# Patient Record
Sex: Female | Born: 1990 | Race: Black or African American | Hispanic: No | Smoking: Former smoker
Health system: Southern US, Community
[De-identification: ages and names within clinical notes are randomized; demographics above are authoritative.]

## PROBLEM LIST (undated history)

## (undated) ENCOUNTER — Inpatient Hospital Stay (HOSPITAL_COMMUNITY): Payer: Self-pay

## (undated) DIAGNOSIS — D649 Anemia, unspecified: Secondary | ICD-10-CM

## (undated) DIAGNOSIS — O139 Gestational [pregnancy-induced] hypertension without significant proteinuria, unspecified trimester: Secondary | ICD-10-CM

## (undated) DIAGNOSIS — R51 Headache: Secondary | ICD-10-CM

## (undated) DIAGNOSIS — I10 Essential (primary) hypertension: Secondary | ICD-10-CM

## (undated) DIAGNOSIS — A749 Chlamydial infection, unspecified: Secondary | ICD-10-CM

## (undated) HISTORY — PX: HAND SURGERY: SHX662

---
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
## 2010-02-12 ENCOUNTER — Inpatient Hospital Stay (HOSPITAL_COMMUNITY): Admission: AD | Admit: 2010-02-12 | Discharge: 2010-02-12 | Payer: Self-pay | Admitting: Obstetrics

## 2010-02-18 ENCOUNTER — Inpatient Hospital Stay (HOSPITAL_COMMUNITY): Admission: AD | Admit: 2010-02-18 | Discharge: 2010-02-19 | Payer: Self-pay | Admitting: Obstetrics

## 2010-12-19 LAB — CBC
HCT: 28.2 % — ABNORMAL LOW (ref 36.0–46.0)
Hemoglobin: 8.5 g/dL — ABNORMAL LOW (ref 12.0–15.0)
Hemoglobin: 9.4 g/dL — ABNORMAL LOW (ref 12.0–15.0)
MCHC: 33.1 g/dL (ref 30.0–36.0)
MCV: 69.2 fL — ABNORMAL LOW (ref 78.0–100.0)
MCV: 70.2 fL — ABNORMAL LOW (ref 78.0–100.0)
Platelets: 278 10*3/uL (ref 150–400)
RBC: 3.65 MIL/uL — ABNORMAL LOW (ref 3.87–5.11)
RDW: 20 % — ABNORMAL HIGH (ref 11.5–15.5)
RDW: 20.3 % — ABNORMAL HIGH (ref 11.5–15.5)
WBC: 11.6 10*3/uL — ABNORMAL HIGH (ref 4.0–10.5)

## 2011-01-16 LAB — CBC
HCT: 29 % — ABNORMAL LOW (ref 36.0–49.0)
MCHC: 32.7 g/dL (ref 31.0–37.0)
MCV: 76.3 fL — ABNORMAL LOW (ref 78.0–98.0)
Platelets: 216 10*3/uL (ref 150–400)
Platelets: 237 10*3/uL (ref 150–400)
RBC: 4.73 MIL/uL (ref 3.80–5.70)
RDW: 15.7 % — ABNORMAL HIGH (ref 11.4–15.5)
WBC: 11.9 10*3/uL (ref 4.5–13.5)

## 2011-01-16 LAB — RPR: RPR Ser Ql: NONREACTIVE

## 2011-02-14 NOTE — H&P (Signed)
NAMEOLUWATONI, ROTUNNO           ACCOUNT NO.:  [id number]   MEDICAL RECORD NO.:  [id number]          PATIENT TYPE:  INP   LOCATION:  9121                          FACILITY:  [facility]   PHYSICIAN:  Roseanna Rainbow, M.D.DATE OF BIRTH:  [birth year]   DATE OF ADMISSION:  10/23/2008  DATE OF DISCHARGE:                              HISTORY & PHYSICAL   CHIEF COMPLAINT:  The patient is a 20 year old para 0 with an estimated  date of confinement of October 27, 2008, with an intrauterine pregnancy  at 39 plus weeks complaining of contractions.   HISTORY OF PRESENT ILLNESS:  Please see the above.   ALLERGIES:  No known drug allergies.   MEDICATIONS:  Prenatal vitamins.   PRENATAL LABORATORIES:  Chlamydia positive, August 12.  Urine culture  and sensitivity, greater than 100,000 colonies per mL.  Staphylococcus  coagulase negative.  GC probe negative.  One-hour GTT 78.  Hepatitis B  surface antigen negative.  Hematocrit 29.9, hemoglobin 10.5.  HIV  nonreactive.  Quad screen negative.  Blood type is AB positive.  Antibody screen negative.  Platelets 265,000.  RPR nonreactive.  Rubella  immune.  Sickle cell negative.  GBS positive on September 21, 2009.   PAST GYN HISTORY:  Noncontributory.   OB RISK FACTORS:  Positive Chlamydia.  DNA probe and  GBS positive on  September 21, 2009.   PAST MEDICAL HISTORY:  No significant history of medical diseases.   PAST SURGICAL HISTORY:  No previous surgery.   SOCIAL HISTORY:  She is a Consulting civil engineer, single.  Does not give any  significant history of alcohol use.  Has no significant smoking history.  There is a history of previous marijuana use.   FAMILY HISTORY:  Remarkable for diabetes, hypertension.   PHYSICAL EXAMINATION:  Vital signs stable, afebrile.  Fetal heart  tracing reassuring.  Tocodynamometer, uterine contractions every 2-4  minutes.  GU:  Sterile vaginal exam 6 cm dilated, 80-90% effaced with vertex at +1  station.  There are prominent  spines.  The membranes were artificially  ruptured for clear fluid.   ASSESSMENT:  Intrauterine pregnancy at term, active labor, GBS positive,  currently on penicillin.  Fetal heart tracing consistent with fetal well-  being.   PLAN:  Admission, expectant management, anticipate a spontaneous vaginal  delivery.      Roseanna Rainbow, M.D.  Electronically Signed     LAJ/MEDQ  D:  10/23/2008  T:  10/24/2008  Job:  [id number]

## 2011-06-29 LAB — URINALYSIS, ROUTINE W REFLEX MICROSCOPIC
Glucose, UA: NEGATIVE
Ketones, ur: NEGATIVE
Leukocytes, UA: NEGATIVE
Nitrite: POSITIVE — AB
Protein, ur: NEGATIVE
Urobilinogen, UA: 0.2

## 2011-06-29 LAB — POCT PREGNANCY, URINE
Operator id: 20265
Preg Test, Ur: POSITIVE

## 2011-06-29 LAB — URINE MICROSCOPIC-ADD ON

## 2012-02-22 ENCOUNTER — Encounter (HOSPITAL_COMMUNITY): Payer: Self-pay | Admitting: *Deleted

## 2012-02-22 ENCOUNTER — Inpatient Hospital Stay (HOSPITAL_COMMUNITY)
Admission: AD | Admit: 2012-02-22 | Discharge: 2012-02-22 | Disposition: A | Discharge status: Home / Self Care | Payer: Self-pay | Source: Ambulatory Visit | Attending: Obstetrics & Gynecology | Admitting: Obstetrics & Gynecology

## 2012-02-22 ENCOUNTER — Inpatient Hospital Stay (HOSPITAL_COMMUNITY): Payer: Self-pay

## 2012-02-22 DIAGNOSIS — R42 Dizziness and giddiness: Secondary | ICD-10-CM | POA: Insufficient documentation

## 2012-02-22 DIAGNOSIS — D649 Anemia, unspecified: Secondary | ICD-10-CM

## 2012-02-22 DIAGNOSIS — Z3490 Encounter for supervision of normal pregnancy, unspecified, unspecified trimester: Secondary | ICD-10-CM

## 2012-02-22 DIAGNOSIS — O99891 Other specified diseases and conditions complicating pregnancy: Secondary | ICD-10-CM | POA: Insufficient documentation

## 2012-02-22 LAB — DIFFERENTIAL
Basophils Relative: 0 % (ref 0–1)
Eosinophils Absolute: 0.3 10*3/uL (ref 0.0–0.7)
Lymphs Abs: 1.8 10*3/uL (ref 0.7–4.0)
Neutro Abs: 4 10*3/uL (ref 1.7–7.7)
Neutrophils Relative %: 60 % (ref 43–77)

## 2012-02-22 LAB — COMPREHENSIVE METABOLIC PANEL
ALT: 11 U/L (ref 0–35)
Albumin: 3.1 g/dL — ABNORMAL LOW (ref 3.5–5.2)
Alkaline Phosphatase: 51 U/L (ref 39–117)
Chloride: 102 mEq/L (ref 96–112)
Potassium: 3.6 mEq/L (ref 3.5–5.1)
Sodium: 137 mEq/L (ref 135–145)
Total Bilirubin: 0.2 mg/dL — ABNORMAL LOW (ref 0.3–1.2)
Total Protein: 6.3 g/dL (ref 6.0–8.3)

## 2012-02-22 LAB — URINE MICROSCOPIC-ADD ON

## 2012-02-22 LAB — URINALYSIS, ROUTINE W REFLEX MICROSCOPIC
Bilirubin Urine: NEGATIVE
Glucose, UA: NEGATIVE mg/dL
Ketones, ur: NEGATIVE mg/dL
Nitrite: NEGATIVE
Specific Gravity, Urine: 1.025 (ref 1.005–1.030)
pH: 6.5 (ref 5.0–8.0)

## 2012-02-22 LAB — CBC
Hemoglobin: 9.9 g/dL — ABNORMAL LOW (ref 12.0–15.0)
MCH: 24.5 pg — ABNORMAL LOW (ref 26.0–34.0)
MCHC: 34.3 g/dL (ref 30.0–36.0)
Platelets: 240 10*3/uL (ref 150–400)
RBC: 4.04 MIL/uL (ref 3.87–5.11)

## 2012-02-22 MED ORDER — FERROUS SULFATE 325 (65 FE) MG PO TABS: 325.0000 mg | ORAL_TABLET | Freq: Two times a day (BID) | ORAL | Status: DC

## 2012-02-22 NOTE — MAU Note (Signed)
Past 2 months, gets short of breath at work, if is in the shower too long has passed out.  Feels dizzy when up.

## 2012-02-22 NOTE — Discharge Instructions (Signed)
Drink 10-12 glasses of water per day. Eat small, frequent snacks/meals every two hours including protein and whole grains. Avoid hot showers. Start iron supplement twice a day and increase iron rich foods in your diet.  Change positions slowly.

## 2012-02-22 NOTE — MAU Note (Signed)
To bathroom to collect urine, instructed pt to call for help, call light shown.

## 2012-02-22 NOTE — MAU Provider Note (Signed)
Meagan [name] 78 y.o.G3P2002 @Unknown  by LMP Chief Complaint  Patient presents with  . Dizziness  . Possible Pregnancy     First Provider Initiated Contact with Patient 02/22/12 1133      SUBJECTIVE  HPI: Pt reports dizziness after long, hot showers and standing a long time at work. Denies syncope, heart palpitations, chest pain, SOB. Unknown LMP late March, ~9 weeks. Denies N/V/D, vaginal bleeding or abd pain. No similar Sx outside of pregnancy.   History reviewed. No pertinent past medical history. History reviewed. No pertinent past surgical history. History   Social History  . Marital Status: Single    Spouse Name: N/A    Number of Children: N/A  . Years of Education: N/A   Occupational History  . Not on file.   Social History Main Topics  . Smoking status: Former Games developer  . Smokeless tobacco: Not on file  . Alcohol Use: No  . Drug Use:   . Sexually Active: Yes   Other Topics Concern  . Not on file   Social History Narrative  . No narrative on file   No current facility-administered medications on file prior to encounter.   No current outpatient prescriptions on file prior to encounter.   No Known Allergies  ROS: Pertinent items in HPI  OBJECTIVE Blood pressure 107/58, pulse 88, temperature 98.1 F (36.7 C), temperature source Oral, resp. rate 16. No orthostatic changes  GENERAL: Well-developed, well-nourished female in no acute distress.  HEENT: Normocephalic, good dentition, no palor HEART: normal rate RESP: normal effort ABDOMEN: Soft, nontender EXTREMITIES: Nontender, no edema NEURO: Alert and oriented SPECULUM EXAM: deferred   LAB RESULTS Results for orders placed during the hospital encounter of 02/22/12 (from the past 24 hour(s))  URINALYSIS, ROUTINE W REFLEX MICROSCOPIC     Status: Abnormal   Collection Time   02/22/12  9:50 AM      Component Value Range   Color, Urine YELLOW  YELLOW    APPearance CLEAR  CLEAR    Specific Gravity,  Urine 1.025  1.005 - 1.030    pH 6.5  5.0 - 8.0    Glucose, UA NEGATIVE  NEGATIVE (mg/dL)   Hgb urine dipstick NEGATIVE  NEGATIVE    Bilirubin Urine NEGATIVE  NEGATIVE    Ketones, ur NEGATIVE  NEGATIVE (mg/dL)   Protein, ur NEGATIVE  NEGATIVE (mg/dL)   Urobilinogen, UA 0.2  0.0 - 1.0 (mg/dL)   Nitrite NEGATIVE  NEGATIVE    Leukocytes, UA TRACE (*) NEGATIVE   URINE MICROSCOPIC-ADD ON     Status: Abnormal   Collection Time   02/22/12  9:50 AM      Component Value Range   Squamous Epithelial / LPF MANY (*) RARE    WBC, UA 3-6  <3 (WBC/hpf)   Bacteria, UA RARE  RARE   POCT PREGNANCY, URINE     Status: Abnormal   Collection Time   02/22/12 10:17 AM      Component Value Range   Preg Test, Ur POSITIVE (*) NEGATIVE   CBC     Status: Abnormal   Collection Time   02/22/12 10:40 AM      Component Value Range   WBC 6.7  4.0 - 10.5 (K/uL)   RBC 4.04  3.87 - 5.11 (MIL/uL)   Hemoglobin 9.9 (*) 12.0 - 15.0 (g/dL)   HCT 29.5 (*) 62.1 - 46.0 (%)   MCV 71.5 (*) 78.0 - 100.0 (fL)   MCH 24.5 (*) 26.0 - 34.0 (pg)  MCHC 34.3  30.0 - 36.0 (g/dL)   RDW 21.3  08.6 - 57.8 (%)   Platelets 240  150 - 400 (K/uL)  DIFFERENTIAL     Status: Normal   Collection Time   02/22/12 10:40 AM      Component Value Range   Neutrophils Relative 60  43 - 77 (%)   Neutro Abs 4.0  1.7 - 7.7 (K/uL)   Lymphocytes Relative 27  12 - 46 (%)   Lymphs Abs 1.8  0.7 - 4.0 (K/uL)   Monocytes Relative 8  3 - 12 (%)   Monocytes Absolute 0.6  0.1 - 1.0 (K/uL)   Eosinophils Relative 5  0 - 5 (%)   Eosinophils Absolute 0.3  0.0 - 0.7 (K/uL)   Basophils Relative 0  0 - 1 (%)   Basophils Absolute 0.0  0.0 - 0.1 (K/uL)  COMPREHENSIVE METABOLIC PANEL     Status: Abnormal   Collection Time   02/22/12 10:40 AM      Component Value Range   Sodium 137  135 - 145 (mEq/L)   Potassium 3.6  3.5 - 5.1 (mEq/L)   Chloride 102  96 - 112 (mEq/L)   CO2 27  19 - 32 (mEq/L)   Glucose, Bld 75  70 - 99 (mg/dL)   BUN 8  6 - 23 (mg/dL)    Creatinine, Ser 4.69 (*) 0.50 - 1.10 (mg/dL)   Calcium 8.8  8.4 - 62.9 (mg/dL)   Total Protein 6.3  6.0 - 8.3 (g/dL)   Albumin 3.1 (*) 3.5 - 5.2 (g/dL)   AST 10  0 - 37 (U/L)   ALT 11  0 - 35 (U/L)   Alkaline Phosphatase 51  39 - 117 (U/L)   Total Bilirubin 0.2 (*) 0.3 - 1.2 (mg/dL)   GFR calc non Af Amer >90  >90 (mL/min)   GFR calc Af Amer >90  >90 (mL/min)  GLUCOSE, CAPILLARY     Status: Normal   Collection Time   02/22/12 11:22 AM      Component Value Range   Glucose-Capillary 93  70 - 99 (mg/dL)   Comment 1 Notify RN      IMAGING 13.3 week IUP, + FHR   ASSESSMENT 1. Anemia   2. Normal pregnancy   3. Pregnancy with uncertain dates    PLAN D/C home Small frequent meals, increase fluids. No hot showers Iron BID Increase dietary iron Follow-up Information    Please follow up. (start prenatal care or MAU as needed if symptoms worsen)         Medication List  As of 02/24/2012  1:09 AM   START taking these medications         ferrous sulfate 325 (65 FE) MG tablet   Take 1 tablet (325 mg total) by mouth 2 (two) times daily.         CONTINUE taking these medications         multivitamin tablet         STOP taking these medications         ibuprofen 200 MG tablet          Where to get your medications    These are the prescriptions that you need to pick up.   You may get these medications from any pharmacy.         ferrous sulfate 325 (65 FE) MG tablet            [place], Koleen Nimrod 02/22/2012  11:46 AM

## 2012-02-22 NOTE — MAU Note (Addendum)
Pt complains of dizziness especially when getting out of shower and at work for approx 2 months when getting hot. States some greenish discharge when wiping.

## 2012-02-23 DIAGNOSIS — D649 Anemia, unspecified: Secondary | ICD-10-CM

## 2012-02-28 NOTE — MAU Provider Note (Signed)
Medical Screening exam and patient care preformed by advanced practice provider.  Agree with the above management.  

## 2012-10-02 NOTE — L&D Delivery Note (Signed)
Delivery Note At 9:38 AM a viable female was delivered via Vaginal, Spontaneous Delivery (Presentation: ;  ).  APGAR: , ; weight .   Placenta status: Intact, Manual removal.  Cord:  with the following complications: .  Cord pH: not done  Anesthesia: Epidural  Episiotomy:  Lacerations:  Suture Repair: 2.0 Est. Blood Loss (mL):   Mom to postpartum.  Baby to nursery-stable.  Prem Coykendall A 04/18/2013, 9:52 AM

## 2012-12-31 ENCOUNTER — Inpatient Hospital Stay (HOSPITAL_COMMUNITY)
Admission: AD | Admit: 2012-12-31 | Discharge: 2012-12-31 | Disposition: A | Discharge status: Home / Self Care | Payer: Medicaid Other | Source: Ambulatory Visit | Attending: Obstetrics & Gynecology | Admitting: Obstetrics & Gynecology

## 2012-12-31 ENCOUNTER — Encounter (HOSPITAL_COMMUNITY): Payer: Self-pay

## 2012-12-31 ENCOUNTER — Inpatient Hospital Stay (HOSPITAL_COMMUNITY): Payer: Medicaid Other

## 2012-12-31 DIAGNOSIS — Z3201 Encounter for pregnancy test, result positive: Secondary | ICD-10-CM

## 2012-12-31 DIAGNOSIS — O0930 Supervision of pregnancy with insufficient antenatal care, unspecified trimester: Secondary | ICD-10-CM | POA: Insufficient documentation

## 2012-12-31 DIAGNOSIS — O99891 Other specified diseases and conditions complicating pregnancy: Secondary | ICD-10-CM | POA: Insufficient documentation

## 2012-12-31 NOTE — MAU Note (Signed)
Patient is in requesting to obtain an ultrasound to verify date of gestation. She denies pain, vaginal bleeding or abnormal discharge.

## 2012-12-31 NOTE — MAU Provider Note (Signed)
  History     CSN: [id number]  Arrival date and time: 12/31/12 1022   None     No chief complaint on file.  HPI [name] is a 22 y.o. G3P2002 at [redacted]w[redacted]d who presents to MAU today for pregnancy confirmation. She denies abdominal pain, vaginal bleeding, N/V or fever. She has had occasional morning sickness, but feels she is still taking in adequate nutrition and hydration. She had some bleeding around the end of December. She is planning to return to Dr. Clearance Coots for prenatal care since that is where she went for her last pregnancy.   OB History   Grav Para Term Preterm Abortions TAB SAB Ect Mult Living   3 2 2  0 0 0 0 0 0 2      History reviewed. No pertinent past medical history.  History reviewed. No pertinent past surgical history.  History reviewed. No pertinent family history.  History  Substance Use Topics  . Smoking status: Former Games developer  . Smokeless tobacco: Not on file  . Alcohol Use: No    Allergies: No Known Allergies  No prescriptions prior to admission    Review of Systems  Constitutional: Negative for fever and malaise/fatigue.  Gastrointestinal: Positive for nausea. Negative for vomiting and abdominal pain.  Genitourinary: Negative for dysuria, urgency and frequency.       + vaginal discharge No irritative symptoms Neg - vaginal bleeding   Physical Exam   Blood pressure 104/58, pulse 75, temperature 98.1 F (36.7 C), temperature source Oral, resp. rate 18, height 5\' 6"  (1.676 m), weight 168 lb 8 oz (76.431 kg), last menstrual period 10/02/2012.  Physical Exam  Constitutional: She is oriented to person, place, and time. She appears well-developed and well-nourished. No distress.  HENT:  Head: Normocephalic and atraumatic.  Cardiovascular: Normal rate and normal heart sounds.   Respiratory: Effort normal and breath sounds normal. No respiratory distress.  GI: Soft. Bowel sounds are normal. She exhibits no distension and no mass. There is  no tenderness. There is no rebound and no guarding.  Patient is measuring size greater than dates  Neurological: She is alert and oriented to person, place, and time.  Skin: Skin is warm and dry. No erythema.  Psychiatric: She has a normal mood and affect.   Results for orders placed during the hospital encounter of 12/31/12 (from the past 24 hour(s))  POCT PREGNANCY, URINE     Status: Abnormal   Collection Time    12/31/12 10:46 AM      Result Value Range   Preg Test, Ur POSITIVE (*) NEGATIVE    MAU Course  Procedures None  MDM Patient measuring greater than dates. [place] today to confirm dating.   Assessment and Plan  A: SIUP at 24w 5 d No prenatal care  P: Discharge home Patient encouraged to call Dr. Verdell Carmine office to see if they will take her for prenatal care this late Patient given contact information for Medicaid assistance and [facility] if needed Patient may return to MAU as needed or if her condition were to change or worsen  Freddi Starr, PA-C  12/31/2012, 3:51 PM

## 2013-03-07 LAB — OB RESULTS CONSOLE GC/CHLAMYDIA: Chlamydia: POSITIVE

## 2013-03-09 ENCOUNTER — Inpatient Hospital Stay (HOSPITAL_COMMUNITY)
Admission: AD | Admit: 2013-03-09 | Discharge: 2013-03-09 | Disposition: A | Discharge status: Home / Self Care | Payer: Medicaid Other | Source: Ambulatory Visit | Attending: Obstetrics | Admitting: Obstetrics

## 2013-03-09 ENCOUNTER — Encounter (HOSPITAL_COMMUNITY): Payer: Self-pay | Admitting: Family

## 2013-03-09 DIAGNOSIS — N949 Unspecified condition associated with female genital organs and menstrual cycle: Secondary | ICD-10-CM | POA: Insufficient documentation

## 2013-03-09 DIAGNOSIS — O4700 False labor before 37 completed weeks of gestation, unspecified trimester: Secondary | ICD-10-CM | POA: Insufficient documentation

## 2013-03-09 DIAGNOSIS — R109 Unspecified abdominal pain: Secondary | ICD-10-CM | POA: Insufficient documentation

## 2013-03-09 DIAGNOSIS — O479 False labor, unspecified: Secondary | ICD-10-CM

## 2013-03-09 DIAGNOSIS — O26893 Other specified pregnancy related conditions, third trimester: Secondary | ICD-10-CM

## 2013-03-09 HISTORY — DX: Essential (primary) hypertension: I10

## 2013-03-09 HISTORY — DX: Chlamydial infection, unspecified: A74.9

## 2013-03-09 HISTORY — DX: Anemia, unspecified: D64.9

## 2013-03-09 LAB — URINALYSIS, ROUTINE W REFLEX MICROSCOPIC
Bilirubin Urine: NEGATIVE
Glucose, UA: NEGATIVE mg/dL
Hgb urine dipstick: NEGATIVE
Ketones, ur: NEGATIVE mg/dL
Protein, ur: NEGATIVE mg/dL
pH: 7.5 (ref 5.0–8.0)

## 2013-03-09 LAB — URINE MICROSCOPIC-ADD ON

## 2013-03-09 LAB — WET PREP, GENITAL
Clue Cells Wet Prep HPF POC: NONE SEEN
Trich, Wet Prep: NONE SEEN
Yeast Wet Prep HPF POC: NONE SEEN

## 2013-03-09 MED ORDER — PRENATAL COMPLETE 14-0.4 MG PO TABS: 1.0000 | ORAL_TABLET | Freq: Every day | ORAL | Status: DC

## 2013-03-09 NOTE — MAU Note (Signed)
Pt states that she has not felt well throughout the night and when she woke up this morning she vomited and noticed a puddle of fluid on the floor. States after she noticed the fluid on the floor she started having abdominal cramping and wants to make sure that her water did not break.

## 2013-03-09 NOTE — MAU Provider Note (Signed)
History     CSN: [id number]  Arrival date and time: 03/09/13 1204   First Provider Initiated Contact with Patient 03/09/13 1243      Chief Complaint  Patient presents with  . Abdominal Pain  . possible rupture of membranes    HPI [name] is a 22 y.o. G3P2002 at [redacted]w[redacted]d who presents to MAU today with complaint of contractions and ?ROM. The patient states that she was woken up hourly with contractions over night. She had one episode of vomiting last night that cased her to have wetness in her underwear. She does not feel that this was urine. She states that she has had increased pressure in the lower abdomen. She denies UTI symptoms, fever, diarrhea or constipation. She reports good fetal movement.   OB History   Grav Para Term Preterm Abortions TAB SAB Ect Mult Living   3 2 2  0 0 0 0 0 0 2      Past Medical History  Diagnosis Date  . Anemia   . Hypertension   . Chlamydia     Past Surgical History  Procedure Laterality Date  . Hand surgery      History reviewed. No pertinent family history.  History  Substance Use Topics  . Smoking status: Former Games developer  . Smokeless tobacco: Never Used  . Alcohol Use: No    Allergies: No Known Allergies  Prescriptions prior to admission  Medication Sig Dispense Refill  . acetaminophen (TYLENOL) 325 MG tablet Take 325 mg by mouth every 6 (six) hours as needed for pain.      . ferrous sulfate 325 (65 FE) MG tablet Take 1 tablet (325 mg total) by mouth 2 (two) times daily.  30 tablet  11  . [DISCONTINUED] Aspirin-Acetaminophen (GOODYS BODY PAIN PO) Take 1 each by mouth daily as needed (body pain).        Review of Systems  Constitutional: Negative for fever and malaise/fatigue.  Gastrointestinal: Positive for nausea, vomiting and abdominal pain. Negative for diarrhea and constipation.  Genitourinary: Negative for dysuria, urgency and frequency.       Neg - vaginal bleeding + Discharge   Physical Exam   Blood  pressure 123/81, pulse 109, temperature 98.3 F (36.8 C), temperature source Oral, resp. rate 18, height 5\' 6"  (1.676 m), weight 168 lb (76.204 kg), last menstrual period 10/02/2012.  Physical Exam  Constitutional: She is oriented to person, place, and time. She appears well-developed and well-nourished. No distress.  HENT:  Head: Normocephalic and atraumatic.  Cardiovascular: Normal rate and normal heart sounds.   Respiratory: Effort normal and breath sounds normal. No respiratory distress.  GI: Soft. Bowel sounds are normal. She exhibits no distension. There is tenderness (mild tenderness to palpation of the lower pelvic region).  Genitourinary: Uterus is enlarged (appropriate for GA). Cervix exhibits discharge (mucus discharge noted at cervical os) and friability. Cervix exhibits no motion tenderness. There is bleeding (noted with cervical probe) around the vagina. Vaginal discharge (small amount of white, mucus discharge noted) found.  Neurological: She is alert and oriented to person, place, and time.  Skin: Skin is warm and dry. No erythema.  Psychiatric: She has a normal mood and affect.  Cervix: closed, thick and posterior  Results for orders placed during the hospital encounter of 03/09/13 (from the past 24 hour(s))  URINALYSIS, ROUTINE W REFLEX MICROSCOPIC     Status: Abnormal   Collection Time    03/09/13 12:20 PM      Result  Value Range   Color, Urine YELLOW  YELLOW   APPearance CLEAR  CLEAR   Specific Gravity, Urine 1.020  1.005 - 1.030   pH 7.5  5.0 - 8.0   Glucose, UA NEGATIVE  NEGATIVE mg/dL   Hgb urine dipstick NEGATIVE  NEGATIVE   Bilirubin Urine NEGATIVE  NEGATIVE   Ketones, ur NEGATIVE  NEGATIVE mg/dL   Protein, ur NEGATIVE  NEGATIVE mg/dL   Urobilinogen, UA 1.0  0.0 - 1.0 mg/dL   Nitrite NEGATIVE  NEGATIVE   Leukocytes, UA TRACE (*) NEGATIVE  URINE MICROSCOPIC-ADD ON     Status: Abnormal   Collection Time    03/09/13 12:20 PM      Result Value Range    Squamous Epithelial / LPF FEW (*) RARE   WBC, UA 0-2  <3 WBC/hpf   Urine-Other MUCOUS PRESENT    WET PREP, GENITAL     Status: Abnormal   Collection Time    03/09/13  1:08 PM      Result Value Range   Yeast Wet Prep HPF POC NONE SEEN  NONE SEEN   Trich, Wet Prep NONE SEEN  NONE SEEN   Clue Cells Wet Prep HPF POC NONE SEEN  NONE SEEN   WBC, Wet Prep HPF POC MANY (*) NONE SEEN   Fetal Monitoring: Baseline: 130 bpm, moderate variability, + accelerations, no decelerations Contractions: none MAU Course  Procedures None  MDM UA, Wet prep, GC/Chlamydia today  Assessment and Plan  A: Vaginal discharge in pregnancy Braxton hicks contractions  P: Discharge home Labor precautions reviwed Patient encouraged to follow-up in the office as scheduled Patient may return to MAU as needed   Freddi Starr, PA-C  03/09/2013, 1:34 PM

## 2013-03-09 NOTE — MAU Note (Signed)
Patient presents to MAU with c/o possible ROM at 2200; abdominal cramping and pressure began at that point; reports she had difficulty sleeping;  Reports she feels constantly thirsty; reports she worked 6 hrs yesterday and was on her feet all day; left work early.  Reports +FM; denies vaginal bleeding.

## 2013-03-11 LAB — GC/CHLAMYDIA PROBE AMP: CT Probe RNA: POSITIVE — AB

## 2013-03-25 ENCOUNTER — Ambulatory Visit (INDEPENDENT_AMBULATORY_CARE_PROVIDER_SITE_OTHER): Payer: Medicaid Other | Admitting: Obstetrics

## 2013-03-25 ENCOUNTER — Encounter: Payer: Self-pay | Admitting: Obstetrics

## 2013-03-25 ENCOUNTER — Encounter: Payer: Self-pay | Admitting: *Deleted

## 2013-03-25 VITALS — BP 121/74 | Temp 97.3°F | Wt 169.0 lb

## 2013-03-25 DIAGNOSIS — A549 Gonococcal infection, unspecified: Secondary | ICD-10-CM

## 2013-03-25 DIAGNOSIS — Z113 Encounter for screening for infections with a predominantly sexual mode of transmission: Secondary | ICD-10-CM

## 2013-03-25 DIAGNOSIS — A7489 Other chlamydial diseases: Secondary | ICD-10-CM

## 2013-03-25 DIAGNOSIS — A5609 Other chlamydial infection of lower genitourinary tract: Secondary | ICD-10-CM

## 2013-03-25 DIAGNOSIS — Z3480 Encounter for supervision of other normal pregnancy, unspecified trimester: Secondary | ICD-10-CM

## 2013-03-25 DIAGNOSIS — Z3483 Encounter for supervision of other normal pregnancy, third trimester: Secondary | ICD-10-CM

## 2013-03-25 DIAGNOSIS — A5400 Gonococcal infection of lower genitourinary tract, unspecified: Secondary | ICD-10-CM

## 2013-03-25 LAB — POCT URINALYSIS DIPSTICK
Glucose, UA: NEGATIVE
Ketones, UA: NEGATIVE
Urobilinogen, UA: NEGATIVE

## 2013-03-25 MED ORDER — AZITHROMYCIN 250 MG PO TABS: ORAL_TABLET | ORAL | Status: DC

## 2013-03-25 MED ORDER — CEFIXIME 400 MG PO TABS: 400.0000 mg | ORAL_TABLET | Freq: Every day | ORAL | Status: DC

## 2013-03-25 NOTE — Progress Notes (Signed)
Pulse-84 Pt c/o "bone pain" and "body aches" x 4 months.

## 2013-03-26 LAB — OBSTETRIC PANEL
Basophils Relative: 0 % (ref 0–1)
Eosinophils Absolute: 0.1 10*3/uL (ref 0.0–0.7)
Eosinophils Relative: 2 % (ref 0–5)
Hepatitis B Surface Ag: NEGATIVE
Lymphs Abs: 2 10*3/uL (ref 0.7–4.0)
MCH: 21.6 pg — ABNORMAL LOW (ref 26.0–34.0)
MCHC: 33.2 g/dL (ref 30.0–36.0)
MCV: 65.1 fL — ABNORMAL LOW (ref 78.0–100.0)
Neutrophils Relative %: 63 % (ref 43–77)
Platelets: 262 10*3/uL (ref 150–400)
RDW: 20 % — ABNORMAL HIGH (ref 11.5–15.5)

## 2013-03-26 LAB — VARICELLA ZOSTER ANTIBODY, IGG: Varicella IgG: 193.4 Index — ABNORMAL HIGH (ref <135.00)

## 2013-03-26 LAB — VITAMIN D 25 HYDROXY (VIT D DEFICIENCY, FRACTURES): Vit D, 25-Hydroxy: 19 ng/mL — ABNORMAL LOW (ref 30–89)

## 2013-03-26 LAB — HIV ANTIBODY (ROUTINE TESTING W REFLEX): HIV: NONREACTIVE

## 2013-03-27 LAB — HEMOGLOBINOPATHY EVALUATION
Hemoglobin Other: 28.6 % — ABNORMAL HIGH
Hgb A2 Quant: 3.3 % — ABNORMAL HIGH (ref 2.2–3.2)
Hgb A: 68.1 % — ABNORMAL LOW (ref 96.8–97.8)
Hgb S Quant: 0 %

## 2013-04-01 LAB — HGB ELECTROPHORESIS REFLEXED REPORT
Hemoglobin A - HGBRFX: 65.1 % — ABNORMAL LOW (ref >96.0)
Hemoglobin F - HGBRFX: 0 % (ref <2.0)

## 2013-04-02 ENCOUNTER — Encounter: Payer: Self-pay | Admitting: Obstetrics

## 2013-04-02 ENCOUNTER — Ambulatory Visit (INDEPENDENT_AMBULATORY_CARE_PROVIDER_SITE_OTHER): Payer: Medicaid Other | Admitting: Obstetrics

## 2013-04-02 VITALS — BP 101/59 | Temp 97.6°F | Wt 171.0 lb

## 2013-04-02 DIAGNOSIS — Z3483 Encounter for supervision of other normal pregnancy, third trimester: Secondary | ICD-10-CM

## 2013-04-02 DIAGNOSIS — Z3480 Encounter for supervision of other normal pregnancy, unspecified trimester: Secondary | ICD-10-CM

## 2013-04-02 LAB — POCT URINALYSIS DIPSTICK
Ketones, UA: NEGATIVE
Spec Grav, UA: 1.015

## 2013-04-02 NOTE — Progress Notes (Signed)
Pulse-95 Pt c/o increased vaginal pressure x 3 days.

## 2013-04-09 ENCOUNTER — Ambulatory Visit (INDEPENDENT_AMBULATORY_CARE_PROVIDER_SITE_OTHER): Payer: Medicaid Other | Admitting: Obstetrics

## 2013-04-09 ENCOUNTER — Encounter: Payer: Self-pay | Admitting: Obstetrics

## 2013-04-09 VITALS — BP 110/70 | Temp 97.7°F | Wt 172.0 lb

## 2013-04-09 DIAGNOSIS — Z3480 Encounter for supervision of other normal pregnancy, unspecified trimester: Secondary | ICD-10-CM

## 2013-04-09 DIAGNOSIS — Z3483 Encounter for supervision of other normal pregnancy, third trimester: Secondary | ICD-10-CM

## 2013-04-09 LAB — POCT URINALYSIS DIPSTICK
Glucose, UA: NEGATIVE
Leukocytes, UA: NEGATIVE
Nitrite, UA: NEGATIVE
pH, UA: 6

## 2013-04-09 NOTE — Progress Notes (Signed)
P 78 Patient has questions about which due date we are going to use.

## 2013-04-16 ENCOUNTER — Encounter (HOSPITAL_COMMUNITY): Payer: Self-pay

## 2013-04-16 ENCOUNTER — Inpatient Hospital Stay (HOSPITAL_COMMUNITY)
Admission: AD | Admit: 2013-04-16 | Discharge: 2013-04-17 | Disposition: A | Discharge status: Home / Self Care | Payer: Medicaid Other | Source: Ambulatory Visit | Attending: Obstetrics & Gynecology | Admitting: Obstetrics & Gynecology

## 2013-04-16 DIAGNOSIS — O26893 Other specified pregnancy related conditions, third trimester: Secondary | ICD-10-CM

## 2013-04-16 DIAGNOSIS — G43009 Migraine without aura, not intractable, without status migrainosus: Secondary | ICD-10-CM | POA: Insufficient documentation

## 2013-04-16 DIAGNOSIS — O99891 Other specified diseases and conditions complicating pregnancy: Secondary | ICD-10-CM | POA: Insufficient documentation

## 2013-04-16 HISTORY — DX: Headache: R51

## 2013-04-16 HISTORY — DX: Gestational (pregnancy-induced) hypertension without significant proteinuria, unspecified trimester: O13.9

## 2013-04-16 MED ORDER — DEXAMETHASONE SODIUM PHOSPHATE 10 MG/ML IJ SOLN
10.0000 mg | Freq: Once | INTRAMUSCULAR | Status: AC
  Administered 2013-04-17: 10 mg via INTRAVENOUS
  Filled 2013-04-16: qty 1

## 2013-04-16 MED ORDER — DIPHENHYDRAMINE HCL 50 MG/ML IJ SOLN
25.0000 mg | Freq: Once | INTRAMUSCULAR | Status: AC
  Administered 2013-04-17: 25 mg via INTRAVENOUS
  Filled 2013-04-16: qty 1

## 2013-04-16 MED ORDER — PROCHLORPERAZINE EDISYLATE 5 MG/ML IJ SOLN
10.0000 mg | Freq: Once | INTRAMUSCULAR | Status: AC
  Administered 2013-04-17: 10 mg via INTRAVENOUS
  Filled 2013-04-16: qty 2

## 2013-04-16 NOTE — MAU Note (Signed)
Migraine headache x 2 days. N/v & decreased appetite x 2 days. Taken tylenol with no relief. Denies contractions, leaking of fluid, or vaginal bleeding. Positive fetal movement.

## 2013-04-16 NOTE — MAU Provider Note (Signed)
Chief Complaint:  Headache   First Provider Initiated Contact with Patient 04/17/13 0008     HPI: Meagan [name] is a 22 y.o. G3P2002 at [redacted]w[redacted]d who presents to maternity admissions reporting migraine HA, nausea and one episode of blurry vision this evening, resolved. Has had similar HA's throughout pregnancy. Minimal relief w/ Tylenol. Some relief w/ fluids and rest. Denies fever, chills, neck pain, difficulty w/ speech or gait, URI Sx, epigastric pain, contractions, leakage of fluid or vaginal bleeding. Good fetal movement.   Pregnancy Course: Few mildly elevated BPs this pregnancy, normal at recent visits.   Past Medical History: Past Medical History  Diagnosis Date  . Anemia   . Chlamydia   . Headache(784.0)   . Hypertension     no meds  . Pregnancy induced hypertension     Past obstetric history: OB History   Grav Para Term Preterm Abortions TAB SAB Ect Mult Living   3 2 2  0 0 0 0 0 0 2     # Outc Date GA Lbr Len/2nd Wgt Sex Del Anes PTL Lv   1 TRM 1/10    M SVD   Yes   2 TRM 5/11    M SVD   Yes   3 CUR               Past Surgical History: Past Surgical History  Procedure Laterality Date  . Hand surgery      Family History: Family History  Problem Relation Age of Onset  . Hypertension    . Diabetes      Social History: History  Substance Use Topics  . Smoking status: Former Smoker    Types: Cigarettes  . Smokeless tobacco: Never Used  . Alcohol Use: No     Comment: early pregnancy    Allergies: No Known Allergies  Meds:  Prescriptions prior to admission  Medication Sig Dispense Refill  . acetaminophen (TYLENOL) 325 MG tablet Take 325 mg by mouth every 6 (six) hours as needed for pain.      . Prenatal Vit-Fe Fumarate-FA (PRENATAL COMPLETE) 14-0.4 MG TABS Take 1 tablet by mouth daily.  60 each  2  . [DISCONTINUED] ferrous sulfate 325 (65 FE) MG tablet Take 1 tablet (325 mg total) by mouth 2 (two) times daily.  30 tablet  11    ROS: Pertinent  findings in history of present illness.  Physical Exam  Blood pressure 112/60, pulse 91, temperature 97.6 F (36.4 C), temperature source Oral, resp. rate 18, last menstrual period 10/02/2012. GENERAL: Well-developed, well-nourished female in no acute distress.  HEENT: Normocephalic. No neck tenderness or facial tenderness. HEART: normal rate RESP: normal effort ABDOMEN: Soft, non-tender, gravid appropriate for gestational age EXTREMITIES: Nontender, no edema,. NEURO: alert and oriented. DTR's 2+, no clonus  FHT:  Baseline 140 , moderate variability, accelerations present, no decelerations Contractions: none   Labs: Results for orders placed during the hospital encounter of 04/16/13 (from the past 24 hour(s))  URINALYSIS, ROUTINE W REFLEX MICROSCOPIC     Status: Abnormal   Collection Time    04/16/13 11:43 PM      Result Value Range   Color, Urine YELLOW  YELLOW   APPearance CLEAR  CLEAR   Specific Gravity, Urine 1.020  1.005 - 1.030   pH 7.0  5.0 - 8.0   Glucose, UA NEGATIVE  NEGATIVE mg/dL   Hgb urine dipstick NEGATIVE  NEGATIVE   Bilirubin Urine NEGATIVE  NEGATIVE   Ketones, ur NEGATIVE  NEGATIVE mg/dL   Protein, ur NEGATIVE  NEGATIVE mg/dL   Urobilinogen, UA 1.0  0.0 - 1.0 mg/dL   Nitrite NEGATIVE  NEGATIVE   Leukocytes, UA TRACE (*) NEGATIVE  URINE MICROSCOPIC-ADD ON     Status: None   Collection Time    04/16/13 11:43 PM      Result Value Range   Squamous Epithelial / LPF RARE  RARE   WBC, UA 0-2  <3 WBC/hpf   RBC / HPF 0-2  <3 RBC/hpf  CBC     Status: Abnormal   Collection Time    04/16/13 11:50 PM      Result Value Range   WBC 8.6  4.0 - 10.5 K/uL   RBC 3.91  3.87 - 5.11 MIL/uL   Hemoglobin 8.4 (*) 12.0 - 15.0 g/dL   HCT 16.1 (*) 09.6 - 04.5 %   MCV 64.2 (*) 78.0 - 100.0 fL   MCH 21.5 (*) 26.0 - 34.0 pg   MCHC 33.5  30.0 - 36.0 g/dL   RDW 40.9 (*) 81.1 - 91.4 %   Platelets 206  150 - 400 K/uL  COMPREHENSIVE METABOLIC PANEL     Status: Abnormal    Collection Time    04/16/13 11:50 PM      Result Value Range   Sodium 137  135 - 145 mEq/L   Potassium 3.5  3.5 - 5.1 mEq/L   Chloride 103  96 - 112 mEq/L   CO2 26  19 - 32 mEq/L   Glucose, Bld 80  70 - 99 mg/dL   BUN 6  6 - 23 mg/dL   Creatinine, Ser 7.82  0.50 - 1.10 mg/dL   Calcium 9.2  8.4 - 95.6 mg/dL   Total Protein 5.9 (*) 6.0 - 8.3 g/dL   Albumin 2.7 (*) 3.5 - 5.2 g/dL   AST 9  0 - 37 U/L   ALT 7  0 - 35 U/L   Alkaline Phosphatase 129 (*) 39 - 117 U/L   Total Bilirubin 0.2 (*) 0.3 - 1.2 mg/dL   GFR calc non Af Amer >90  >90 mL/min   GFR calc Af Amer >90  >90 mL/min    Imaging:  No results found.  MAU Course: HA, nausea resolved w/ migraine cocktail.   Assessment: 1. Migraine without aura   2. Headache in pregnancy, antepartum, third trimester   No evidence of pre-eclampsia Plan: Discharge home in stable condition. Labor precautions, PIH precautions and fetal kick counts.     Follow-up Information   Follow up with Roseanna Rainbow, MD On 04/17/2013. (as scheduled)    Contact information:   [address] [place] [address] [phone]       Follow up with [facility] [place] [facility]ERN[facility] ADMISSIONS. (As needed if symptoms worsen)    Contact information:   [address] [id number] [place] [address] [phone]       Medication List         acetaminophen 325 MG tablet  Commonly known as:  TYLENOL  Take 325 mg by mouth every 6 (six) hours as needed for pain.     butalbital-acetaminophen-caffeine 50-325-40 MG per tablet  Commonly known as:  FIORICET  Take 1-2 tablets by mouth every 6 (six) hours as needed for headache.     ferrous sulfate 325 (65 FE) MG tablet  Take 1 tablet (325 mg total) by mouth 2 (two) times daily.     Prenatal Complete 14-0.4  MG Tabs  Take 1 tablet by mouth daily.        Dorathy Kinsman, CNM 04/17/2013 1:00 AM

## 2013-04-17 ENCOUNTER — Ambulatory Visit (INDEPENDENT_AMBULATORY_CARE_PROVIDER_SITE_OTHER): Payer: Medicaid Other | Admitting: Obstetrics

## 2013-04-17 VITALS — BP 121/75 | Temp 98.6°F | Wt 175.4 lb

## 2013-04-17 DIAGNOSIS — O480 Post-term pregnancy: Secondary | ICD-10-CM

## 2013-04-17 DIAGNOSIS — G43009 Migraine without aura, not intractable, without status migrainosus: Secondary | ICD-10-CM

## 2013-04-17 DIAGNOSIS — Z3480 Encounter for supervision of other normal pregnancy, unspecified trimester: Secondary | ICD-10-CM

## 2013-04-17 LAB — URINE MICROSCOPIC-ADD ON

## 2013-04-17 LAB — COMPREHENSIVE METABOLIC PANEL
ALT: 7 U/L (ref 0–35)
AST: 9 U/L (ref 0–37)
CO2: 26 mEq/L (ref 19–32)
Calcium: 9.2 mg/dL (ref 8.4–10.5)
GFR calc non Af Amer: >90 mL/min (ref >90)
Sodium: 137 mEq/L (ref 135–145)
Total Protein: 5.9 g/dL — ABNORMAL LOW (ref 6.0–8.3)

## 2013-04-17 LAB — POCT URINALYSIS DIPSTICK
Blood, UA: NEGATIVE
Nitrite, UA: NEGATIVE
Spec Grav, UA: <=1.005
Urobilinogen, UA: NEGATIVE
pH, UA: 7

## 2013-04-17 LAB — CBC
MCH: 21.5 pg — ABNORMAL LOW (ref 26.0–34.0)
Platelets: 206 10*3/uL (ref 150–400)
RBC: 3.91 MIL/uL (ref 3.87–5.11)

## 2013-04-17 LAB — URINALYSIS, ROUTINE W REFLEX MICROSCOPIC
Bilirubin Urine: NEGATIVE
Nitrite: NEGATIVE
Specific Gravity, Urine: 1.02 (ref 1.005–1.030)
Urobilinogen, UA: 1 mg/dL (ref 0.0–1.0)

## 2013-04-17 MED ORDER — FERROUS SULFATE 325 (65 FE) MG PO TABS: 325.0000 mg | ORAL_TABLET | Freq: Two times a day (BID) | ORAL | Status: DC

## 2013-04-17 MED ORDER — BUTALBITAL-APAP-CAFFEINE 50-325-40 MG PO TABS: 1.0000 | ORAL_TABLET | Freq: Four times a day (QID) | ORAL | Status: DC | PRN

## 2013-04-17 NOTE — Progress Notes (Signed)
Pulse: 89

## 2013-04-18 ENCOUNTER — Encounter (HOSPITAL_COMMUNITY): Payer: Self-pay | Admitting: *Deleted

## 2013-04-18 ENCOUNTER — Encounter: Payer: Self-pay | Admitting: Obstetrics

## 2013-04-18 ENCOUNTER — Inpatient Hospital Stay (HOSPITAL_COMMUNITY)
Admission: AD | Admit: 2013-04-18 | Discharge: 2013-04-19 | DRG: 774 | Disposition: A | Discharge status: Home / Self Care | Payer: Medicaid Other | Source: Ambulatory Visit | Attending: Obstetrics | Admitting: Obstetrics

## 2013-04-18 ENCOUNTER — Encounter (HOSPITAL_COMMUNITY): Payer: Self-pay | Admitting: Anesthesiology

## 2013-04-18 ENCOUNTER — Inpatient Hospital Stay (HOSPITAL_COMMUNITY): Payer: Medicaid Other | Admitting: Anesthesiology

## 2013-04-18 DIAGNOSIS — D649 Anemia, unspecified: Secondary | ICD-10-CM | POA: Diagnosis present

## 2013-04-18 DIAGNOSIS — O9902 Anemia complicating childbirth: Secondary | ICD-10-CM | POA: Diagnosis present

## 2013-04-18 LAB — CBC
HCT: 26.7 % — ABNORMAL LOW (ref 36.0–46.0)
Hemoglobin: 9.1 g/dL — ABNORMAL LOW (ref 12.0–15.0)
MCHC: 34.1 g/dL (ref 30.0–36.0)
MCV: 64.3 fL — ABNORMAL LOW (ref 78.0–100.0)
RDW: 18.6 % — ABNORMAL HIGH (ref 11.5–15.5)
WBC: 10.1 10*3/uL (ref 4.0–10.5)

## 2013-04-18 MED ORDER — LACTATED RINGERS IV SOLN
500.0000 mL | Freq: Once | INTRAVENOUS | Status: AC
  Administered 2013-04-18: 500 mL via INTRAVENOUS

## 2013-04-18 MED ORDER — DIPHENHYDRAMINE HCL 25 MG PO CAPS: 25.0000 mg | ORAL_CAPSULE | Freq: Four times a day (QID) | ORAL | Status: DC | PRN

## 2013-04-18 MED ORDER — OXYTOCIN BOLUS FROM INFUSION: 500.0000 mL | INTRAVENOUS | Status: DC

## 2013-04-18 MED ORDER — OXYCODONE-ACETAMINOPHEN 5-325 MG PO TABS: 1.0000 | ORAL_TABLET | ORAL | Status: DC | PRN

## 2013-04-18 MED ORDER — DIBUCAINE 1 % RE OINT: 1.0000 "application " | TOPICAL_OINTMENT | RECTAL | Status: DC | PRN

## 2013-04-18 MED ORDER — FLEET ENEMA 7-19 GM/118ML RE ENEM: 1.0000 | ENEMA | RECTAL | Status: DC | PRN

## 2013-04-18 MED ORDER — LIDOCAINE HCL (PF) 1 % IJ SOLN
30.0000 mL | INTRAMUSCULAR | Status: DC | PRN
  Filled 2013-04-18 (×3): qty 30

## 2013-04-18 MED ORDER — PHENYLEPHRINE 40 MCG/ML (10ML) SYRINGE FOR IV PUSH (FOR BLOOD PRESSURE SUPPORT)
80.0000 ug | PREFILLED_SYRINGE | INTRAVENOUS | Status: DC | PRN
  Filled 2013-04-18: qty 2

## 2013-04-18 MED ORDER — TETANUS-DIPHTH-ACELL PERTUSSIS 5-2.5-18.5 LF-MCG/0.5 IM SUSP
0.5000 mL | Freq: Once | INTRAMUSCULAR | Status: AC
  Administered 2013-04-19: 0.5 mL via INTRAMUSCULAR
  Filled 2013-04-18: qty 0.5

## 2013-04-18 MED ORDER — EPHEDRINE 5 MG/ML INJ
10.0000 mg | INTRAVENOUS | Status: DC | PRN
  Filled 2013-04-18: qty 2
  Filled 2013-04-18: qty 4

## 2013-04-18 MED ORDER — PHENYLEPHRINE 40 MCG/ML (10ML) SYRINGE FOR IV PUSH (FOR BLOOD PRESSURE SUPPORT)
80.0000 ug | PREFILLED_SYRINGE | INTRAVENOUS | Status: DC | PRN
  Filled 2013-04-18: qty 5
  Filled 2013-04-18: qty 2

## 2013-04-18 MED ORDER — ZOLPIDEM TARTRATE 5 MG PO TABS: 5.0000 mg | ORAL_TABLET | Freq: Every evening | ORAL | Status: DC | PRN

## 2013-04-18 MED ORDER — BENZOCAINE-MENTHOL 20-0.5 % EX AERO
1.0000 "application " | INHALATION_SPRAY | CUTANEOUS | Status: DC | PRN
  Administered 2013-04-18: 1 via TOPICAL
  Filled 2013-04-18: qty 56

## 2013-04-18 MED ORDER — ONDANSETRON HCL 4 MG/2ML IJ SOLN: 4.0000 mg | Freq: Four times a day (QID) | INTRAMUSCULAR | Status: DC | PRN

## 2013-04-18 MED ORDER — FERROUS SULFATE 325 (65 FE) MG PO TABS
325.0000 mg | ORAL_TABLET | Freq: Two times a day (BID) | ORAL | Status: DC
  Administered 2013-04-18 – 2013-04-19 (×2): 325 mg via ORAL
  Filled 2013-04-18 (×2): qty 1

## 2013-04-18 MED ORDER — SODIUM BICARBONATE 8.4 % IV SOLN
INTRAVENOUS | Status: DC | PRN
  Administered 2013-04-18: 5 mL via EPIDURAL

## 2013-04-18 MED ORDER — EPHEDRINE 5 MG/ML INJ
10.0000 mg | INTRAVENOUS | Status: DC | PRN
  Filled 2013-04-18: qty 2

## 2013-04-18 MED ORDER — SENNOSIDES-DOCUSATE SODIUM 8.6-50 MG PO TABS
2.0000 | ORAL_TABLET | Freq: Every day | ORAL | Status: DC
  Administered 2013-04-18: 2 via ORAL

## 2013-04-18 MED ORDER — DIPHENHYDRAMINE HCL 50 MG/ML IJ SOLN: 12.5000 mg | INTRAMUSCULAR | Status: DC | PRN

## 2013-04-18 MED ORDER — ACETAMINOPHEN 325 MG PO TABS: 650.0000 mg | ORAL_TABLET | ORAL | Status: DC | PRN

## 2013-04-18 MED ORDER — BUTORPHANOL TARTRATE 1 MG/ML IJ SOLN: 1.0000 mg | INTRAMUSCULAR | Status: DC | PRN

## 2013-04-18 MED ORDER — ONDANSETRON HCL 4 MG/2ML IJ SOLN: 4.0000 mg | INTRAMUSCULAR | Status: DC | PRN

## 2013-04-18 MED ORDER — SIMETHICONE 80 MG PO CHEW: 80.0000 mg | CHEWABLE_TABLET | ORAL | Status: DC | PRN

## 2013-04-18 MED ORDER — IBUPROFEN 600 MG PO TABS
600.0000 mg | ORAL_TABLET | Freq: Four times a day (QID) | ORAL | Status: DC | PRN
  Filled 2013-04-18 (×4): qty 1

## 2013-04-18 MED ORDER — OXYTOCIN 40 UNITS IN LACTATED RINGERS INFUSION - SIMPLE MED
62.5000 mL/h | INTRAVENOUS | Status: DC
  Administered 2013-04-18: 62.5 mL/h via INTRAVENOUS
  Filled 2013-04-18 (×2): qty 1000

## 2013-04-18 MED ORDER — ONDANSETRON HCL 4 MG PO TABS: 4.0000 mg | ORAL_TABLET | ORAL | Status: DC | PRN

## 2013-04-18 MED ORDER — LANOLIN HYDROUS EX OINT: TOPICAL_OINTMENT | CUTANEOUS | Status: DC | PRN

## 2013-04-18 MED ORDER — LACTATED RINGERS IV SOLN: 500.0000 mL | INTRAVENOUS | Status: DC | PRN

## 2013-04-18 MED ORDER — PRENATAL MULTIVITAMIN CH
1.0000 | ORAL_TABLET | Freq: Every day | ORAL | Status: DC
  Administered 2013-04-18 – 2013-04-19 (×2): 1 via ORAL
  Filled 2013-04-18 (×2): qty 1

## 2013-04-18 MED ORDER — WITCH HAZEL-GLYCERIN EX PADS: 1.0000 "application " | MEDICATED_PAD | CUTANEOUS | Status: DC | PRN

## 2013-04-18 MED ORDER — CITRIC ACID-SODIUM CITRATE 334-500 MG/5ML PO SOLN: 30.0000 mL | ORAL | Status: DC | PRN

## 2013-04-18 MED ORDER — LACTATED RINGERS IV SOLN
INTRAVENOUS | Status: DC
  Administered 2013-04-18: 08:00:00 via INTRAVENOUS

## 2013-04-18 MED ORDER — IBUPROFEN 600 MG PO TABS
600.0000 mg | ORAL_TABLET | Freq: Four times a day (QID) | ORAL | Status: DC
  Administered 2013-04-18 – 2013-04-19 (×5): 600 mg via ORAL
  Filled 2013-04-18: qty 1

## 2013-04-18 MED ORDER — FENTANYL 2.5 MCG/ML BUPIVACAINE 1/10 % EPIDURAL INFUSION (WH - ANES)
14.0000 mL/h | INTRAMUSCULAR | Status: DC | PRN
  Administered 2013-04-18: 14 mL/h via EPIDURAL
  Filled 2013-04-18: qty 125

## 2013-04-18 NOTE — Anesthesia Procedure Notes (Signed)
Epidural Patient location during procedure: OB  Preanesthetic Checklist Completed: patient identified, site marked, surgical consent, pre-op evaluation, timeout performed, IV checked, risks and benefits discussed and monitors and equipment checked  Epidural Patient position: sitting Prep: site prepped and draped and DuraPrep Patient monitoring: continuous pulse ox and blood pressure Approach: midline Injection technique: LOR air  Needle:  Needle type: Tuohy  Needle gauge: 17 G Needle length: 9 cm and 9 Needle insertion depth: 5 cm cm Catheter type: closed end flexible Catheter size: 19 Gauge Catheter at skin depth: 11 cm Test dose: negative  Assessment Events: blood not aspirated, injection not painful, no injection resistance, negative IV test and no paresthesia  Additional Notes Dosing of Epidural:  1st dose, through catheter ............................................. epi 1:200K + Xylocaine 40 mg  2nd dose, through catheter, after waiting 3 minutes.....epi 1:200K + Xylocaine 60 mg    ( 2% Xylo charted as a single dose in Epic Meds for ease of charting; actual dosing was fractionated as above, for saftey's sake)  As each dose occurred, patient was free of IV sx; and patient exhibited no evidence of SA injection.  Patient is more comfortable after epidural dosed. Please see RN's note for documentation of vital signs,and FHR which are stable.  Patient reminded not to try to ambulate with numb legs, and that an RN must be present when she attempts to get up.       

## 2013-04-18 NOTE — H&P (Signed)
This is Dr. Francoise Ceo dictating the history and physical on Meagan [name] she's a 22 year old gravida 3 para 202 at 40 weeks and 1 day Meagan [name] 04/17/2013 negative GBS was admitted in labor 6 cm dilated progressed rapidly and had a normal vaginal delivery of a female Apgar 99 placenta spontaneous no episiotomy or laceration Past medical history negative Past surgical history negative Social history negative System review negative Physical exam well-developed female in no distress HEENT negative Lungs clear to P&A Heart regular rhythm no murmurs no gallops Breasts negative Abdomen uterus 20 weeks' postpartum size Pelvic as described above Extremities negative and and and

## 2013-04-18 NOTE — Anesthesia Postprocedure Evaluation (Signed)
Anesthesia Post Note  Patient: Meagan [name]  Procedure(s) Performed: * No procedures listed *  Anesthesia type: Epidural  Patient location: Mother/Baby  Post pain: Pain level controlled  Post assessment: Post-op Vital signs reviewed  Last Vitals: BP 118/77  Pulse 72  Temp(Src) 36.9 C (Oral)  Resp 20  Ht 5\' 3"  (1.6 m)  Wt 176 lb (79.833 kg)  BMI 31.18 kg/m2  SpO2 100%  LMP 10/02/2012  Post vital signs: Reviewed  Level of consciousness: awake  Complications: No apparent anesthesia complications

## 2013-04-18 NOTE — MAU Note (Signed)
Pt reports uc's since 0300

## 2013-04-18 NOTE — Anesthesia Preprocedure Evaluation (Signed)

## 2013-04-18 NOTE — MAU Note (Signed)
PT SAYS SHE HAS BEEN HURTING  SINCE 0300.  VE THIS AM IN OFFICE - 3 CM.  DENIES SROM, BLEEDING, HSV, MRSA.Marland Kitchen   GBS- NEG

## 2013-04-19 LAB — CBC
Platelets: 192 10*3/uL (ref 150–400)
RBC: 3.67 MIL/uL — ABNORMAL LOW (ref 3.87–5.11)
RDW: 18.6 % — ABNORMAL HIGH (ref 11.5–15.5)
WBC: 8.3 10*3/uL (ref 4.0–10.5)

## 2013-04-19 MED ORDER — FUSION PLUS PO CAPS: 1.0000 | ORAL_CAPSULE | Freq: Every day | ORAL | Status: DC

## 2013-04-19 MED ORDER — IBUPROFEN 600 MG PO TABS: 600.0000 mg | ORAL_TABLET | Freq: Four times a day (QID) | ORAL | Status: DC | PRN

## 2013-04-19 MED ORDER — MEDROXYPROGESTERONE ACETATE 150 MG/ML IM SUSP
150.0000 mg | Freq: Once | INTRAMUSCULAR | Status: AC
  Administered 2013-04-19: 150 mg via INTRAMUSCULAR
  Filled 2013-04-19: qty 1

## 2013-04-19 MED ORDER — OXYCODONE-ACETAMINOPHEN 5-325 MG PO TABS: 1.0000 | ORAL_TABLET | ORAL | Status: DC | PRN

## 2013-04-19 NOTE — Progress Notes (Signed)
Mother of baby informed that car seat was not safe for infant. The straps were not properly installed and were not secure enough to protect baby. Mother stated she understood the risks and decided to take the infant home in that car seat. Stated she would get another.

## 2013-04-19 NOTE — Progress Notes (Signed)
CSW consulted with MOB about LPNC.  No barriers to discharge at this time.  Full consult report to follow.  [phone] 

## 2013-04-19 NOTE — Discharge Summary (Signed)
Obstetric Discharge Summary Reason for Admission: onset of labor Prenatal Procedures: ultrasound Intrapartum Procedures: spontaneous vaginal delivery Postpartum Procedures: none Complications-Operative and Postpartum: none Hemoglobin  Date Value Range Status  04/19/2013 8.0* 12.0 - 15.0 g/dL Final     HCT  Date Value Range Status  04/19/2013 23.3* 36.0 - 46.0 % Final    Physical Exam:  General: alert and no distress Lochia: appropriate Uterine Fundus: firm Incision: None DVT Evaluation: No evidence of DVT seen on physical exam.  Discharge Diagnoses: Term Pregnancy-delivered  Discharge Information: Date: 04/19/2013 Activity: pelvic rest Diet: routine Medications: None, Ibuprofen, Colace, Iron and Percocet Condition: stable Instructions: refer to practice specific booklet Discharge to: home Follow-up Information   Follow up with Mansour Balboa A, MD. Schedule an appointment as soon as possible for [name] visit in 2 weeks.   Contact information:   [address] [place] [address] [phone]       Newborn Data: Live born female  Birth Weight: 6 lb 15.5 oz (3160 g) APGAR: 9, 9  Home with mother.  Meagan [name] 04/19/2013, 10:23 AM

## 2013-04-19 NOTE — Progress Notes (Signed)
Post Partum Day 1 Subjective: no complaints, up ad lib, voiding, tolerating PO and + flatus  Objective: Blood pressure 119/81, pulse 71, temperature 97.7 F (36.5 C), temperature source Oral, resp. rate 18, height 5\' 3"  (1.6 m), weight 176 lb (79.833 kg), last menstrual period 10/02/2012, SpO2 100.00%, unknown if currently breastfeeding.  Physical Exam:  General: alert and no distress Lochia: appropriate Uterine Fundus: firm Incision: none DVT Evaluation: No evidence of DVT seen on physical exam.   Recent Labs  04/18/13 0435 04/19/13 0630  HGB 9.1* 8.0*  HCT 26.7* 23.3*    Assessment/Plan: Plan for discharge tomorrow.  Anemia.  Chronic.  Clinically stable.  Iron Rx.   LOS: 1 day   Jaydn Moscato A 04/19/2013, 7:38 AM

## 2013-04-20 NOTE — Clinical Social Work Note (Signed)
Clinical Social Work Department PSYCHOSOCIAL ASSESSMENT - MATERNAL/CHILD 04/20/2013  Patient:  Meagan [name],Meagan [name]  Account Number:  [id number]  Admit Date:  04/18/2013  Childs Name:   Royal    Clinical Social Worker:  Bear Osten, LCSW   Date/Time:  04/19/2013 02:00 PM  Date Referred:  04/19/2013   Referral source  Physician     Referred reason  LPNC   Other referral source:    I:  FAMILY / HOME ENVIRONMENT Child's legal guardian:  PARENT  Guardian - Name Guardian - Age Guardian - Address  Tanasia Knobel [address] [place], [place] [address]  [place]     Other household support members/support persons Name Relationship DOB  2 other children     Other support:   MOB reports good family support    II  PSYCHOSOCIAL DATA Information Source:  Patient Interview  Financial and Community Resources Employment:   Financial resources:  Medicaid If Medicaid - County:  [place]  School / Grade:   Maternity Care Coordinator / Child Services Coordination / Early Interventions:  Cultural issues impacting care:    III  STRENGTHS Strengths  Adequate Resources  Home prepared for Child (including basic supplies)  Supportive family/friends   Strength comment:    IV  RISK FACTORS AND CURRENT PROBLEMS Current Problem:  None   Risk Factor & Current Problem Patient Issue Family Issue Risk Factor / Current Problem Comment   [name] [name]     V  SOCIAL WORK ASSESSMENT CSW spoke with MOB at bedside.  CSW discussed MOB's emotional stability during pregnancy and delivery.  MOB reports no hx of dep/anx and no current concerns.  MOB expressed she knows symptoms of PPD.  CSW discussed LPNC. MOB reports she was having issues with medicaid and coverage to receive prenatal care.  MOB reports no concerns with medicaid at this time and knows her caseworker to contact that she delivered baby. CSW discussed hospital policy to drug screen infant and MOB was understanding. MOB reports receiving food  stamps and planning on looking into WIC.  CSW asked MOB about supplies and family support. MOB expressed having good support from FOB who was also in room and other family in the area.  MOB reports no concerns at this time with supplies for infant.  No hx of SA or concerns at this time.  Please reconsult CSW if further needs arise.      VI SOCIAL WORK PLAN Social Work Plan  No Further Intervention Required / No Barriers to Discharge   Type of pt/family education:   If child protective services report - county:   If child protective services report - date:   Information/referral to community resources comment:   Other social work plan:    

## 2013-04-21 NOTE — Progress Notes (Signed)
Post discharge chart review completed.  

## 2013-04-22 ENCOUNTER — Encounter: Payer: Medicaid Other | Admitting: Obstetrics

## 2013-04-23 ENCOUNTER — Encounter: Payer: Self-pay | Admitting: Obstetrics

## 2013-05-07 ENCOUNTER — Ambulatory Visit (INDEPENDENT_AMBULATORY_CARE_PROVIDER_SITE_OTHER): Payer: Medicaid Other | Admitting: Obstetrics

## 2013-05-07 ENCOUNTER — Encounter: Payer: Self-pay | Admitting: Obstetrics

## 2013-05-07 DIAGNOSIS — Z3049 Encounter for surveillance of other contraceptives: Secondary | ICD-10-CM

## 2013-05-07 MED ORDER — MEDROXYPROGESTERONE ACETATE 150 MG/ML IM SUSP: 150.0000 mg | INTRAMUSCULAR | Status: DC

## 2013-05-07 NOTE — Progress Notes (Signed)
Subjective:     Meagan [name] is a 22 y.o. female who presents for a postpartum visit. She is 2 weeks postpartum following a spontaneous vaginal delivery. I have fully reviewed the prenatal and intrapartum course. The delivery was at 40.1 gestational weeks. Outcome: spontaneous vaginal delivery. Anesthesia: epidural. Postpartum course has been WNL. Baby's course has been WNL. Baby is feeding by both breast and bottle - Daron Offer. Bleeding staining only. Bowel function is normal. Bladder function is normal. Patient is not sexually active. Contraception method is Depo-Provera injections. Postpartum depression screening: negative.  The following portions of the patient's history were reviewed and updated as appropriate: allergies, current medications, past family history, past medical history, past social history, past surgical history and problem list.  Review of Systems Pertinent items are noted in HPI.   Objective:    BP 134/77  Pulse 87  Temp(Src) 98.9 F (37.2 C)  Ht 5\' 5"  (1.651 m)  Wt 156 lb (70.761 kg)  BMI 25.96 kg/m2  Breastfeeding? Yes  General:  alert and no distress   Breasts:  inspection negative, no nipple discharge or bleeding, no masses or nodularity palpable  Lungs: clear to auscultation bilaterally  Heart:  regular rate and rhythm, S1, S2 normal, no murmur, click, rub or gallop  Abdomen: normal findings: soft, non-tender   Vulva:  normal  Vagina: normal vagina  Cervix:  no lesions  Corpus: normal size, contour, position, consistency, mobility, non-tender  Adnexa:  no mass, fullness, tenderness  Rectal Exam: Not performed.        Assessment:     Normal postpartum exam. Pap smear not done at today's visit.   Plan:    1. Contraception: Depo-Provera injections 2. Depo Provera Rx 3. Follow up in: 3 months or as needed.

## 2014-08-03 ENCOUNTER — Encounter: Payer: Self-pay | Admitting: Obstetrics

## 2014-10-06 ENCOUNTER — Emergency Department (HOSPITAL_COMMUNITY)
Admission: EM | Admit: 2014-10-06 | Discharge: 2014-10-06 | Disposition: A | Discharge status: Home / Self Care | Payer: Medicaid Other | Attending: Emergency Medicine | Admitting: Emergency Medicine

## 2014-10-06 ENCOUNTER — Emergency Department (HOSPITAL_COMMUNITY): Payer: Medicaid Other

## 2014-10-06 ENCOUNTER — Encounter (HOSPITAL_COMMUNITY): Payer: Self-pay | Admitting: Emergency Medicine

## 2014-10-06 DIAGNOSIS — R059 Cough, unspecified: Secondary | ICD-10-CM

## 2014-10-06 DIAGNOSIS — J0300 Acute streptococcal tonsillitis, unspecified: Secondary | ICD-10-CM | POA: Insufficient documentation

## 2014-10-06 DIAGNOSIS — R42 Dizziness and giddiness: Secondary | ICD-10-CM | POA: Insufficient documentation

## 2014-10-06 DIAGNOSIS — Z8619 Personal history of other infectious and parasitic diseases: Secondary | ICD-10-CM | POA: Insufficient documentation

## 2014-10-06 DIAGNOSIS — D649 Anemia, unspecified: Secondary | ICD-10-CM | POA: Insufficient documentation

## 2014-10-06 DIAGNOSIS — R05 Cough: Secondary | ICD-10-CM

## 2014-10-06 DIAGNOSIS — Z79899 Other long term (current) drug therapy: Secondary | ICD-10-CM | POA: Insufficient documentation

## 2014-10-06 DIAGNOSIS — M791 Myalgia: Secondary | ICD-10-CM | POA: Insufficient documentation

## 2014-10-06 DIAGNOSIS — R112 Nausea with vomiting, unspecified: Secondary | ICD-10-CM | POA: Insufficient documentation

## 2014-10-06 DIAGNOSIS — Z87891 Personal history of nicotine dependence: Secondary | ICD-10-CM | POA: Insufficient documentation

## 2014-10-06 DIAGNOSIS — R5383 Other fatigue: Secondary | ICD-10-CM | POA: Insufficient documentation

## 2014-10-06 DIAGNOSIS — I10 Essential (primary) hypertension: Secondary | ICD-10-CM | POA: Insufficient documentation

## 2014-10-06 LAB — RAPID STREP SCREEN (MED CTR MEBANE ONLY): STREPTOCOCCUS, GROUP A SCREEN (DIRECT): POSITIVE — AB

## 2014-10-06 MED ORDER — AMOXICILLIN 500 MG PO CAPS
500.0000 mg | ORAL_CAPSULE | Freq: Once | ORAL | Status: AC
  Administered 2014-10-06: 500 mg via ORAL
  Filled 2014-10-06: qty 1

## 2014-10-06 MED ORDER — AMOXICILLIN 500 MG PO CAPS: 500.0000 mg | ORAL_CAPSULE | Freq: Two times a day (BID) | ORAL | Status: DC

## 2014-10-06 MED ORDER — ACETAMINOPHEN 325 MG PO TABS
650.0000 mg | ORAL_TABLET | Freq: Once | ORAL | Status: AC
  Administered 2014-10-06: 650 mg via ORAL
  Filled 2014-10-06: qty 2

## 2014-10-06 MED ORDER — DEXAMETHASONE SODIUM PHOSPHATE 10 MG/ML IJ SOLN
10.0000 mg | Freq: Once | INTRAMUSCULAR | Status: AC
  Administered 2014-10-06: 10 mg via INTRAMUSCULAR
  Filled 2014-10-06: qty 1

## 2014-10-06 NOTE — ED Notes (Signed)
Pts grandmother picks up her phone and makes a phone call while this nurse is attempting to explain the medication that the pt will be receiving. This nurse asked the pts grandmother if she could call the caller back or if she would like me to return later. The grandmother said "I'm not the pt", this nurse explained that I was not able to speak to the pt over the phone call. The pt then told her grandmother to hang up because this nurse was not able to speak. While administering medication to the pt the grandmother said "well I'm not the pt I should be able to talk on the phone in here". This nurse again explained that I am not able to speak to the pt and explain discharge instructions or administer medications when the pt is unable to hear me. After the medication was administered and the discharge instructions had been understood the pt was given a work note and escorted off of the unit.

## 2014-10-06 NOTE — ED Notes (Signed)
Pt has been vomiting for a week, coughing, sore throat, nasal congestion. Fever here. Malaise and body aches.

## 2014-10-06 NOTE — ED Notes (Signed)
Pt was on phone when this nurse entered pts room. Pt remained on phone for duration of vitals being taken. Pt was asked if she could call the caller back or if she needed this nurse to return later. The pt then hung up the phone.

## 2014-10-06 NOTE — Discharge Instructions (Signed)
Use amoxicillin 1 tablet twice a day for strep throat. Return to the ER if any increase in the swelling, difficulty tolerating your own secretions, shortness of breath, nausea, vomiting or worsening of symptoms of any kind.  Tonsillitis Tonsillitis is an infection of the throat that causes the tonsils to become red, tender, and swollen. Tonsils are collections of lymphoid tissue at the back of the throat. Each tonsil has crevices (crypts). Tonsils help fight nose and throat infections and keep infection from spreading to other parts of the body for the first 18 months of life.  CAUSES Sudden (acute) tonsillitis is usually caused by infection with streptococcal bacteria. Long-lasting (chronic) tonsillitis occurs when the crypts of the tonsils become filled with pieces of food and bacteria, which makes it easy for the tonsils to become repeatedly infected. SYMPTOMS  Symptoms of tonsillitis include:  A sore throat, with possible difficulty swallowing.  White patches on the tonsils.  Fever.  Tiredness.  New episodes of snoring during sleep, when you did not snore before.  Small, foul-smelling, yellowish-white pieces of material (tonsilloliths) that you occasionally cough up or spit out. The tonsilloliths can also cause you to have bad breath. DIAGNOSIS Tonsillitis can be diagnosed through a physical exam. Diagnosis can be confirmed with the results of lab tests, including a throat culture. TREATMENT  The goals of tonsillitis treatment include the reduction of the severity and duration of symptoms and prevention of associated conditions. Symptoms of tonsillitis can be improved with the use of steroids to reduce the swelling. Tonsillitis caused by bacteria can be treated with antibiotic medicines. Usually, treatment with antibiotic medicines is started before the cause of the tonsillitis is known. However, if it is determined that the cause is not bacterial, antibiotic medicines will not treat the  tonsillitis. If attacks of tonsillitis are severe and frequent, your health care provider may recommend surgery to remove the tonsils (tonsillectomy). HOME CARE INSTRUCTIONS   Rest as much as possible and get plenty of sleep.  Drink plenty of fluids. While the throat is very sore, eat soft foods or liquids, such as sherbet, soups, or instant breakfast drinks.  Eat frozen ice pops.  Gargle with a warm or cold liquid to help soothe the throat. Mix 1/4 teaspoon of salt and 1/4 teaspoon of baking soda in 8 oz of water. SEEK MEDICAL CARE IF:   Large, tender lumps develop in your neck.  A rash develops.  A green, yellow-brown, or bloody substance is coughed up.  You are unable to swallow liquids or food for 24 hours.  You notice that only one of the tonsils is swollen. SEEK IMMEDIATE MEDICAL CARE IF:   You develop any new symptoms such as vomiting, severe headache, stiff neck, chest pain, or trouble breathing or swallowing.  You have severe throat pain along with drooling or voice changes.  You have severe pain, unrelieved with recommended medications.  You are unable to fully open the mouth.  You develop redness, swelling, or severe pain anywhere in the neck.  You have a fever. MAKE SURE YOU:   Understand these instructions.  Will watch your condition.  Will get help right away if you are not doing well or get worse. Document Released: 06/28/2005 Document Revised: 02/02/2014 Document Reviewed: 03/07/2013 [facility] Patient Information 2015 [employer], [place]. This information is not intended to replace advice given to you by your health care provider. Make sure you discuss any questions you have with your health care provider.

## 2014-10-06 NOTE — ED Provider Notes (Signed)
CSN: [id number]     Arrival date & time 10/06/14  1417 History  This chart was scribed for non-physician practitioner, Jinny Sanders, PA-C, working with Benny Lennert, MD, by Bronson Curb, ED Scribe. This patient was seen in room TR05C/TR05C and the patient's care was started at 4:58 PM.   Chief Complaint  Patient presents with  . Influenza    The history is provided by the patient. No language interpreter was used.     HPI Comments: ERABELLA [name] is a 24 y.o. female, with history of anemia and HTN, who presents to the Emergency Department complaining of constant, moderate sore throat for the past 3 days. There is associated intermittent fever (triage temp 101.6 F), persistent, throbbing HA, pain with swallowing, nausea, vomiting, dizziness, fatigue, and generalized body aches. Patient states she has history of HA. She reports she has not eaten since onset of symptoms and reports she is unable to tolerate liquids due to sore throat. She reports taking Tylenol with only temporary relief. Patient notes sick contacts with her child who was diagnosed with an ear infection last week. She denies nasal congestion or cough.   Past Medical History  Diagnosis Date  . Anemia   . Chlamydia   . Headache(784.0)   . Hypertension     no meds  . Pregnancy induced hypertension    Past Surgical History  Procedure Laterality Date  . Hand surgery     Family History  Problem Relation Age of Onset  . Hypertension    . Diabetes     History  Substance Use Topics  . Smoking status: Former Smoker    Types: Cigarettes  . Smokeless tobacco: Never Used  . Alcohol Use: No     Comment: early pregnancy   OB History    Gravida Para Term Preterm AB TAB SAB Ectopic Multiple Living   0 0 0 0 0 0 3     Review of Systems  Constitutional: Positive for fever and fatigue.  HENT: Positive for sore throat and trouble swallowing (pain with swallowing). Negative for congestion.   Respiratory:  Negative for cough.   Gastrointestinal: Positive for nausea and vomiting.  Musculoskeletal: Positive for myalgias.  Neurological: Positive for dizziness and headaches.      Allergies  Review of patient's allergies indicates no known allergies.  Home Medications   Prior to Admission medications   Medication Sig Start Date End Date Taking? Authorizing Provider  amoxicillin (AMOXIL) 500 MG capsule Take 1 capsule (500 mg total) by mouth 2 (two) times daily. 10/06/14   Monte Fantasia, PA-C  ibuprofen (ADVIL,MOTRIN) 600 MG tablet Take 1 tablet (600 mg total) by mouth every 6 (six) hours as needed for pain (pain scale < 4). 04/19/13   Brock Bad, MD  Iron-FA-B Cmp-C-Biot-Probiotic (FUSION PLUS) CAPS Take 1 capsule by mouth daily before breakfast. 04/19/13   Brock Bad, MD  medroxyPROGESTERone (DEPO-PROVERA) 150 MG/ML injection Inject 1 mL (150 mg total) into the muscle every 3 (three) months. 05/07/13   Brock Bad, MD  oxyCODONE-acetaminophen (PERCOCET/ROXICET) 5-325 MG per tablet Take 1-2 tablets by mouth every 4 (four) hours as needed for pain. 04/19/13   Brock Bad, MD   Triage Vitals: BP 104/63 mmHg  Pulse 100  Temp(Src) 99.7 F (37.6 C) (Oral)  Resp 18  SpO2 99%  LMP 09/21/2014  Physical Exam  Constitutional: She is oriented to person, place, and time. She appears well-developed and well-nourished. No  distress.  HENT:  Head: Normocephalic and atraumatic.  Mouth/Throat: Oropharyngeal exudate present.  TMs clear bilaterally. Basal turbinence nonerythematous and nonedematous bilaterally. Tonsils mildly enlarged with exudates bilaterally.  Eyes: Conjunctivae and EOM are normal.  Neck: Neck supple. No tracheal deviation present.  Cardiovascular: Normal rate.   Pulmonary/Chest: Effort normal. No respiratory distress.  Musculoskeletal: Normal range of motion.  Neurological: She is alert and oriented to person, place, and time. She has normal strength. No cranial nerve  deficit or sensory deficit. She displays a negative Romberg sign. Gait normal. GCS eye subscore is 4. GCS verbal subscore is 5. GCS motor subscore is 6.  Patient fully alert, answering questions appropriately in full, clear sentences. Motor strength 5 out of 5 in all major muscle groups of upper and lower extremity is. Cranial nerves II through XII grossly intact. Distal sensation intact.  Skin: Skin is warm and dry.  Psychiatric: She has a normal mood and affect. Her behavior is normal.  Nursing note and vitals reviewed.   ED Course  Procedures (including critical care time)  DIAGNOSTIC STUDIES: Oxygen Saturation is 99% on room air, normal by my interpretation.    COORDINATION OF CARE: At 1705 Discussed treatment plan with patient which includes ABX. Patient agrees.   Labs Review Labs Reviewed  RAPID STREP SCREEN - Abnormal; Notable for the following:    Streptococcus, Group A Screen (Direct) POSITIVE (*)    All other components within normal limits  RESPIRATORY VIRUS PANEL    Imaging Review Dg Chest 2 View  10/06/2014   CLINICAL DATA:  Cough  EXAM: CHEST  2 VIEW  COMPARISON:  None.  FINDINGS: The heart size and mediastinal contours are within normal limits. Both lungs are clear. The visualized skeletal structures are unremarkable.  IMPRESSION: No active cardiopulmonary disease.   Electronically Signed   By: Marlan Palauharles  Clark M.D.  On: 10/06/2014 15:23     EKG Interpretation None      MDM   Final diagnoses:  Strep tonsillitis    Pt febrile with tonsillar exudate, cervical lymphadenopathy, & dysphagia; diagnosis of strep. Treated in the Ed with steroids, NSAIDs, Pain medication and amoxicillin.  Pt appears mildly dehydrated, discussed importance of water rehydration. Presentation non concerning for PTA or infxn spread to soft tissue. No trismus or uvula deviation.   Pt HA treated and improved while in ED.  Presentation is like pts typical HA and non concerning for [facility], ICH,  Meningitis, or temporal arteritis. Pt is afebrile with no focal neuro deficits, nuchal rigidity, or change in vision.   Specific return precautions discussed. Pt able to drink water in ED without difficulty with intact air way. Recommended PCP follow up. Patient verbalizes understanding and agreement to this plan. I encouraged patient to call or return to the ER should she have any questions or concerns.  I personally performed the services described in this documentation, which was scribed in my presence. The recorded information has been reviewed and is accurate.  BP 104/63 mmHg  Pulse 100  Temp(Src) 99.7 F (37.6 C) (Oral)  Resp 18  SpO2 99%  LMP 09/21/2014  Signed,  Ladona MowJoe Asia Favata, PA-C 6:16 PM    Monte FantasiaJoseph W Monty Mccarrell, PA-C 10/06/14 1816  Benny LennertJoseph L Zammit, MD 10/09/14 1323

## 2014-10-07 LAB — RESPIRATORY VIRUS PANEL
Adenovirus: NOT DETECTED
INFLUENZA A H1: NOT DETECTED
INFLUENZA A: NOT DETECTED
Influenza A H3: NOT DETECTED
Influenza B: NOT DETECTED
Metapneumovirus: NOT DETECTED
PARAINFLUENZA 1 A: NOT DETECTED
PARAINFLUENZA 2 A: NOT DETECTED
Parainfluenza 3: NOT DETECTED
RESPIRATORY SYNCYTIAL VIRUS B: NOT DETECTED
Respiratory Syncytial Virus A: NOT DETECTED
Rhinovirus: NOT DETECTED

## 2015-01-13 ENCOUNTER — Inpatient Hospital Stay (HOSPITAL_COMMUNITY)
Admission: EM | Admit: 2015-01-13 | Discharge: 2015-01-13 | Disposition: A | Discharge status: Home / Self Care | Payer: Self-pay | Source: Ambulatory Visit | Attending: Obstetrics & Gynecology | Admitting: Obstetrics & Gynecology

## 2015-01-13 ENCOUNTER — Inpatient Hospital Stay (HOSPITAL_COMMUNITY): Payer: Medicaid Other

## 2015-01-13 ENCOUNTER — Encounter (HOSPITAL_COMMUNITY): Payer: Self-pay | Admitting: *Deleted

## 2015-01-13 DIAGNOSIS — O046 Delayed or excessive hemorrhage following (induced) termination of pregnancy: Secondary | ICD-10-CM | POA: Insufficient documentation

## 2015-01-13 DIAGNOSIS — O074 Failed attempted termination of pregnancy without complication: Secondary | ICD-10-CM

## 2015-01-13 DIAGNOSIS — O034 Incomplete spontaneous abortion without complication: Secondary | ICD-10-CM

## 2015-01-13 DIAGNOSIS — Z87891 Personal history of nicotine dependence: Secondary | ICD-10-CM | POA: Insufficient documentation

## 2015-01-13 DIAGNOSIS — N939 Abnormal uterine and vaginal bleeding, unspecified: Secondary | ICD-10-CM

## 2015-01-13 DIAGNOSIS — I10 Essential (primary) hypertension: Secondary | ICD-10-CM | POA: Insufficient documentation

## 2015-01-13 DIAGNOSIS — N920 Excessive and frequent menstruation with regular cycle: Secondary | ICD-10-CM

## 2015-01-13 LAB — CBC
HCT: 27.6 % — ABNORMAL LOW (ref 36.0–46.0)
Hemoglobin: 9.5 g/dL — ABNORMAL LOW (ref 12.0–15.0)
MCH: 24.5 pg — ABNORMAL LOW (ref 26.0–34.0)
MCHC: 34.4 g/dL (ref 30.0–36.0)
MCV: 71.1 fL — ABNORMAL LOW (ref 78.0–100.0)
PLATELETS: 326 10*3/uL (ref 150–400)
RBC: 3.88 MIL/uL (ref 3.87–5.11)
RDW: 16.5 % — AB (ref 11.5–15.5)
WBC: 8.4 10*3/uL (ref 4.0–10.5)

## 2015-01-13 LAB — HCG, QUANTITATIVE, PREGNANCY: hCG, Beta Chain, Quant, S: 4658 m[IU]/mL — ABNORMAL HIGH (ref <5)

## 2015-01-13 MED ORDER — DEXTROSE 5 % IN LACTATED RINGERS IV BOLUS
1000.0000 mL | Freq: Once | INTRAVENOUS | Status: AC
  Administered 2015-01-13: 1000 mL via INTRAVENOUS

## 2015-01-13 MED ORDER — KETOROLAC TROMETHAMINE 60 MG/2ML IM SOLN
60.0000 mg | Freq: Once | INTRAMUSCULAR | Status: AC
  Administered 2015-01-13: 60 mg via INTRAMUSCULAR
  Filled 2015-01-13: qty 2

## 2015-01-13 MED ORDER — IBUPROFEN 600 MG PO TABS: 600.0000 mg | ORAL_TABLET | Freq: Four times a day (QID) | ORAL | Status: DC | PRN

## 2015-01-13 MED ORDER — HYDROCODONE-ACETAMINOPHEN 5-325 MG PO TABS: 1.0000 | ORAL_TABLET | ORAL | Status: DC | PRN

## 2015-01-13 NOTE — MAU Provider Note (Signed)
History     CSN: [id number]  Arrival date and time: 01/13/15 4098   First Provider Initiated Contact with Patient 01/13/15 [phone]      Chief Complaint  Patient presents with  . Vaginal Bleeding   HPI    [name] is a 24 y.o. female [phone] who presents with heavy vaginal bleeding. The bleeding started three days ago; and has gotten heavier over the last few days. She feels that every time she has gotten up to the bathroom she gets dizzy. She has a history of anemia and takes iron daily. She is currently on birth control pills and uses them exactly as prescribed. She does not have a history of irregular cycles, or heavy vaginal bleeding. This is the first time she has every bled this much. This is not the time of her regular cycle; her last cycle was 3/24; she bled for 4 days and stopped.   Of note, the patient had a TAB at the end of February; she had a D&C done at [facility]. She went there yesterday and was told to come directly here due to the amount of bleeding she was having.   OB History    Gravida Para Term Preterm AB TAB SAB Ectopic Multiple Living   0 1 0 0 0 0 3      Past Medical History  Diagnosis Date  . Anemia   . Chlamydia   . Headache(784.0)   . Hypertension     no meds  . Pregnancy induced hypertension     Past Surgical History  Procedure Laterality Date  . Hand surgery      Family History  Problem Relation Age of Onset  . Hypertension    . Diabetes      History  Substance Use Topics  . Smoking status: Former Smoker    Types: Cigarettes  . Smokeless tobacco: Never Used  . Alcohol Use: No     Comment: early pregnancy    Allergies: No Known Allergies  Prescriptions prior to admission  Medication Sig Dispense Refill Last Dose  . amoxicillin (AMOXIL) 500 MG capsule Take 1 capsule (500 mg total) by mouth 2 (two) times daily. 20 capsule 0   . ibuprofen (ADVIL,MOTRIN) 600 MG tablet Take 1 tablet (600 mg total) by mouth  every 6 (six) hours as needed for pain (pain scale < 4). 30 tablet 5 Taking  . Iron-FA-B Cmp-C-Biot-Probiotic (FUSION PLUS) CAPS Take 1 capsule by mouth daily before breakfast. 30 capsule 5 Taking  . medroxyPROGESTERone (DEPO-PROVERA) 150 MG/ML injection Inject 1 mL (150 mg total) into the muscle every 3 (three) months. 1 mL 0   . oxyCODONE-acetaminophen (PERCOCET/ROXICET) 5-325 MG per tablet Take 1-2 tablets by mouth every 4 (four) hours as needed for pain. 40 tablet 0 Not Taking   Results for orders placed or performed during the hospital encounter of 01/13/15 (from the past 48 hour(s))  CBC     Status: Abnormal   Collection Time: 01/13/15  9:20 AM  Result Value Ref Range   WBC 8.4 4.0 - 10.5 K/uL   RBC 3.88 3.87 - 5.11 MIL/uL   Hemoglobin 9.5 (L) 12.0 - 15.0 g/dL   HCT 21.3 (L) 08.6 - 57.8 %   MCV 71.1 (L) 78.0 - 100.0 fL   MCH 24.5 (L) 26.0 - 34.0 pg   MCHC 34.4 30.0 - 36.0 g/dL   RDW 46.9 (H) 62.9 - 52.8 %   Platelets 326 150 -  400 K/uL    Comment: SPECIMEN CHECKED FOR CLOTS REPEATED TO VERIFY   hCG, quantitative, pregnancy     Status: Abnormal   Collection Time: 01/13/15  9:20 AM  Result Value Ref Range   hCG, Beta Chain, Quant, S 4658 (H) <5 mIU/mL    Comment:          GEST. AGE      CONC.  (mIU/mL)   <=1 WEEK        5 - 50     2 WEEKS       50 - 500     3 WEEKS       100 - 10,000     4 WEEKS     1,000 - 30,000     5 WEEKS     3,500 - 115,000   6-8 WEEKS     12,000 - 270,000    12 WEEKS     15,000 - 220,000        FEMALE AND NON-PREGNANT FEMALE:     LESS THAN 5 mIU/mL     US Transvaginal Non-ob  01/13/2015   ADDENDUM REPORT: 01/13/2015 11:11  ADDENDUM: Further information has become available since dictation. The patient's quantitative beta HCG level is 4,600 with an abortion occurring in February. Given this history, the findings within the endometrium are concerning for retained products of conception. This has been discussed with Victorino Dike, the patient's nurse  practitioner.   Electronically Signed   By: Charlett Nose M.D.   On: 01/13/2015 11:11   01/13/2015   CLINICAL DATA:  Heavy vaginal bleeding.  EXAM: TRANSABDOMINAL AND TRANSVAGINAL ULTRASOUND OF PELVIS  TECHNIQUE: Both transabdominal and transvaginal ultrasound examinations of the pelvis were performed. Transabdominal technique was performed for global imaging of the pelvis including uterus, ovaries, adnexal regions, and pelvic cul-de-sac. It was necessary to proceed with endovaginal exam following the transabdominal exam to visualize the endometrium.  COMPARISON:  None  FINDINGS: Uterus  Measurements: 9.9 x 4.8 x 5.9 cm. No fibroids or other mass visualized.  Endometrium  Thickness: Thickened, measuring 21 mm. Complex appearance with increased vascularity. Small amount of fluid in the endometrial canal. In the area with fluid, there is questionable irregularity of the endometrium.  Right ovary  Measurements: 2.9 x 1.4 x 2.0 cm. Normal appearance/no adnexal mass.  Left ovary  Measurements: 3.3 x 1.7 x 2.2 cm. Normal appearance/no adnexal mass.  Other findings  No free fluid.  IMPRESSION: Thickened, complex appearance of the endometrium. If bleeding remains unresponsive to hormonal or medical therapy, focal lesion work-up with sonohysterogram should be considered. Endometrial biopsy should also be considered in pre-menopausal patients at high risk for endometrial carcinoma. (Ref: Radiological Reasoning: Algorithmic Workup of Abnormal Vaginal Bleeding with Endovaginal Sonography and Sonohysterography. AJR 2008; 562:Z30-86)  Electronically Signed: By: Charlett Nose M.D. On: 01/13/2015 10:51   US Pelvis Complete  01/13/2015   ADDENDUM REPORT: 01/13/2015 11:11  ADDENDUM: Further information has become available since dictation. The patient's quantitative beta HCG level is 4,600 with an abortion occurring in February. Given this history, the findings within the endometrium are concerning for retained products of  conception. This has been discussed with Victorino Dike, the patient's nurse practitioner.   Electronically Signed   By: Charlett Nose M.D.   On: 01/13/2015 11:11   01/13/2015   CLINICAL DATA:  Heavy vaginal bleeding.  EXAM: TRANSABDOMINAL AND TRANSVAGINAL ULTRASOUND OF PELVIS  TECHNIQUE: Both transabdominal and transvaginal ultrasound examinations of the pelvis were performed. Transabdominal technique  was performed for global imaging of the pelvis including uterus, ovaries, adnexal regions, and pelvic cul-de-sac. It was necessary to proceed with endovaginal exam following the transabdominal exam to visualize the endometrium.  COMPARISON:  None  FINDINGS: Uterus  Measurements: 9.9 x 4.8 x 5.9 cm. No fibroids or other mass visualized.  Endometrium  Thickness: Thickened, measuring 21 mm. Complex appearance with increased vascularity. Small amount of fluid in the endometrial canal. In the area with fluid, there is questionable irregularity of the endometrium.  Right ovary  Measurements: 2.9 x 1.4 x 2.0 cm. Normal appearance/no adnexal mass.  Left ovary  Measurements: 3.3 x 1.7 x 2.2 cm. Normal appearance/no adnexal mass.  Other findings  No free fluid.  IMPRESSION: Thickened, complex appearance of the endometrium. If bleeding remains unresponsive to hormonal or medical therapy, focal lesion work-up with sonohysterogram should be considered. Endometrial biopsy should also be considered in pre-menopausal patients at high risk for endometrial carcinoma. (Ref: Radiological Reasoning: Algorithmic Workup of Abnormal Vaginal Bleeding with Endovaginal Sonography and Sonohysterography. AJR 2008; 161:W96-04; 191:S68-73)  Electronically Signed: By: Charlett NoseKevin  Dover M.D. On: 01/13/2015 10:51    Review of Systems  Constitutional: Positive for malaise/fatigue.  Gastrointestinal: Positive for abdominal pain.  Neurological: Positive for dizziness.   Physical Exam   Blood pressure 106/65, pulse 83, temperature 97.7 F (36.5 C), temperature source  Oral, resp. rate 18, last menstrual period 12/29/2014, currently breastfeeding.  Physical Exam  Constitutional: She is oriented to person, place, and time. She appears well-developed and well-nourished. No distress.  HENT:  Head: Normocephalic.  Eyes: Pupils are equal, round, and reactive to light.  Respiratory: Effort normal.  Genitourinary:  Speculum exam: Vagina - small-moderate sized blood clots in vaginal canal. Scant amount of blood noted at os.  Cervix - + bleeding  Bimanual exam: Cervix slightly open  Uterus non tender, enlarged  + left and right adnexal discomfort  Chaperone present for exam.  Musculoskeletal: Normal range of motion.  Neurological: She is alert and oriented to person, place, and time.  Skin: She is not diaphoretic. There is pallor.  Psychiatric: Her behavior is normal.    MAU Course  Procedures  None  MDM Discussed patient with Dr. Debroah LoopArnold  [facility] she [facility] this morning to notify them of what was going on. She is scheduled to be seen by them tomorrow at 0830 to have another D&C due to POC.  Abnormal initial orthostatic vitals  1 liter of D5LR bolus given Patient states she feels much [name] after the fluid bolus   Orthostatic vital signs normal after 1 liter of fluid bolus.   Assessment and Plan   A:  1. Retained products of conception following abortion   2. Excessive vaginal bleeding     P:  Discharge home in stable condition Iron daily; start today Keep your appointment [facility] tomorrow morning at 0830 am Return to MAU if symptoms worsen; if bleeding worsens Stay home from work/school [facility].   Duane LopeJennifer I Madellyn Denio, NP 01/13/2015 11:40 AM

## 2015-01-13 NOTE — MAU Note (Signed)
Pt reports she had an abortion 2 months ago. Had a regular period last month and has not had intercourse since the abortion.

## 2015-01-13 NOTE — MAU Note (Signed)
Bleeding for 3 weeks, very heavy the last 4 days, passing clots.  Severe lower abd cramping.  Changing 2-3 pads/hr.

## 2015-01-14 LAB — HIV ANTIBODY (ROUTINE TESTING W REFLEX): HIV SCREEN 4TH GENERATION: NONREACTIVE

## 2015-02-02 ENCOUNTER — Emergency Department (HOSPITAL_COMMUNITY)
Admission: EM | Admit: 2015-02-02 | Discharge: 2015-02-02 | Disposition: A | Discharge status: Home / Self Care | Payer: Self-pay | Attending: Emergency Medicine | Admitting: Emergency Medicine

## 2015-02-02 ENCOUNTER — Emergency Department (HOSPITAL_COMMUNITY): Payer: Self-pay

## 2015-02-02 ENCOUNTER — Encounter (HOSPITAL_COMMUNITY): Payer: Self-pay | Admitting: Emergency Medicine

## 2015-02-02 ENCOUNTER — Emergency Department (HOSPITAL_COMMUNITY): Payer: Medicaid Other

## 2015-02-02 DIAGNOSIS — Z862 Personal history of diseases of the blood and blood-forming organs and certain disorders involving the immune mechanism: Secondary | ICD-10-CM | POA: Insufficient documentation

## 2015-02-02 DIAGNOSIS — R1011 Right upper quadrant pain: Secondary | ICD-10-CM | POA: Insufficient documentation

## 2015-02-02 DIAGNOSIS — Z8619 Personal history of other infectious and parasitic diseases: Secondary | ICD-10-CM | POA: Insufficient documentation

## 2015-02-02 DIAGNOSIS — I10 Essential (primary) hypertension: Secondary | ICD-10-CM | POA: Insufficient documentation

## 2015-02-02 DIAGNOSIS — Z3202 Encounter for pregnancy test, result negative: Secondary | ICD-10-CM | POA: Insufficient documentation

## 2015-02-02 DIAGNOSIS — Z79899 Other long term (current) drug therapy: Secondary | ICD-10-CM | POA: Insufficient documentation

## 2015-02-02 DIAGNOSIS — R109 Unspecified abdominal pain: Secondary | ICD-10-CM

## 2015-02-02 DIAGNOSIS — Z87891 Personal history of nicotine dependence: Secondary | ICD-10-CM | POA: Insufficient documentation

## 2015-02-02 LAB — CBC WITH DIFFERENTIAL/PLATELET
Basophils Absolute: 0 10*3/uL (ref 0.0–0.1)
Basophils Relative: 0 % (ref 0–1)
EOS ABS: 0.3 10*3/uL (ref 0.0–0.7)
EOS PCT: 4 % (ref 0–5)
HCT: 25.9 % — ABNORMAL LOW (ref 36.0–46.0)
Hemoglobin: 8.6 g/dL — ABNORMAL LOW (ref 12.0–15.0)
LYMPHS ABS: 2.2 10*3/uL (ref 0.7–4.0)
LYMPHS PCT: 31 % (ref 12–46)
MCH: 22.8 pg — ABNORMAL LOW (ref 26.0–34.0)
MCHC: 33.2 g/dL (ref 30.0–36.0)
MCV: 68.7 fL — ABNORMAL LOW (ref 78.0–100.0)
MONO ABS: 0.9 10*3/uL (ref 0.1–1.0)
MONOS PCT: 12 % (ref 3–12)
NEUTROS PCT: 53 % (ref 43–77)
Neutro Abs: 3.7 10*3/uL (ref 1.7–7.7)
PLATELETS: 307 10*3/uL (ref 150–400)
RBC: 3.77 MIL/uL — AB (ref 3.87–5.11)
RDW: 16.3 % — AB (ref 11.5–15.5)
WBC: 7.1 10*3/uL (ref 4.0–10.5)

## 2015-02-02 LAB — COMPREHENSIVE METABOLIC PANEL
ALK PHOS: 57 U/L (ref 38–126)
ALT: 12 U/L — AB (ref 14–54)
ANION GAP: 7 (ref 5–15)
AST: 11 U/L — ABNORMAL LOW (ref 15–41)
Albumin: 4.2 g/dL (ref 3.5–5.0)
BUN: 10 mg/dL (ref 6–20)
CALCIUM: 9.4 mg/dL (ref 8.9–10.3)
CO2: 28 mmol/L (ref 22–32)
Chloride: 106 mmol/L (ref 101–111)
Creatinine, Ser: 0.53 mg/dL (ref 0.44–1.00)
GLUCOSE: 92 mg/dL (ref 70–99)
POTASSIUM: 3.7 mmol/L (ref 3.5–5.1)
Sodium: 141 mmol/L (ref 135–145)
TOTAL PROTEIN: 7.9 g/dL (ref 6.5–8.1)
Total Bilirubin: 0.2 mg/dL — ABNORMAL LOW (ref 0.3–1.2)

## 2015-02-02 LAB — I-STAT BETA HCG BLOOD, ED (MC, WL, AP ONLY): HCG, QUANTITATIVE: 8.3 m[IU]/mL — AB (ref <5)

## 2015-02-02 LAB — URINALYSIS, ROUTINE W REFLEX MICROSCOPIC
Bilirubin Urine: NEGATIVE
GLUCOSE, UA: NEGATIVE mg/dL
HGB URINE DIPSTICK: NEGATIVE
Ketones, ur: NEGATIVE mg/dL
LEUKOCYTES UA: NEGATIVE
Nitrite: POSITIVE — AB
PH: 6 (ref 5.0–8.0)
PROTEIN: NEGATIVE mg/dL
Specific Gravity, Urine: 1.022 (ref 1.005–1.030)
Urobilinogen, UA: 1 mg/dL (ref 0.0–1.0)

## 2015-02-02 LAB — URINE MICROSCOPIC-ADD ON

## 2015-02-02 LAB — POC URINE PREG, ED: Preg Test, Ur: NEGATIVE

## 2015-02-02 LAB — LIPASE, BLOOD: LIPASE: 20 U/L — AB (ref 22–51)

## 2015-02-02 MED ORDER — NAPROXEN 500 MG PO TABS: 500.0000 mg | ORAL_TABLET | Freq: Two times a day (BID) | ORAL | Status: DC

## 2015-02-02 MED ORDER — KETOROLAC TROMETHAMINE 30 MG/ML IJ SOLN
30.0000 mg | Freq: Once | INTRAMUSCULAR | Status: AC
  Administered 2015-02-02: 30 mg via INTRAVENOUS
  Filled 2015-02-02: qty 1

## 2015-02-02 MED ORDER — ONDANSETRON HCL 4 MG/2ML IJ SOLN
4.0000 mg | Freq: Once | INTRAMUSCULAR | Status: AC
  Administered 2015-02-02: 4 mg via INTRAVENOUS
  Filled 2015-02-02: qty 2

## 2015-02-02 MED ORDER — SODIUM CHLORIDE 0.9 % IV SOLN
1000.0000 mL | Freq: Once | INTRAVENOUS | Status: AC
Start: 2015-02-02 — End: 2015-02-02
  Administered 2015-02-02: 1000 mL via INTRAVENOUS

## 2015-02-02 MED ORDER — SODIUM CHLORIDE 0.9 % IV SOLN
1000.0000 mL | INTRAVENOUS | Status: DC
  Administered 2015-02-02: 1000 mL via INTRAVENOUS

## 2015-02-02 MED ORDER — CEPHALEXIN 500 MG PO CAPS: 500.0000 mg | ORAL_CAPSULE | Freq: Four times a day (QID) | ORAL | Status: DC

## 2015-02-02 MED ORDER — HYDROMORPHONE HCL 1 MG/ML IJ SOLN
0.5000 mg | INTRAMUSCULAR | Status: DC | PRN
  Administered 2015-02-02 (×2): 0.5 mg via INTRAVENOUS
  Filled 2015-02-02 (×2): qty 1

## 2015-02-02 MED ORDER — HYDROCODONE-ACETAMINOPHEN 5-325 MG PO TABS: 1.0000 | ORAL_TABLET | ORAL | Status: DC | PRN

## 2015-02-02 NOTE — ED Notes (Signed)
Called patient back to room, pt not in lobby. Checked outside. Pt not outside. Will re-assess.

## 2015-02-02 NOTE — ED Notes (Signed)
Pt c/o lower abdominal pain and nausea x 3 days. Denies diarrhea or emesis.

## 2015-02-02 NOTE — Discharge Instructions (Signed)
Abdominal Pain °Many things can cause abdominal pain. Usually, abdominal pain is not caused by a disease and will improve without treatment. It can often be observed and treated at home. Your health care provider will do a physical exam and possibly order blood tests and X-rays to help determine the seriousness of your pain. However, in many cases, more time must pass before a clear cause of the pain can be found. Before that point, your health care provider may not know if you need more testing or further treatment. °HOME CARE INSTRUCTIONS  °Monitor your abdominal pain for any changes. The following actions may help to alleviate any discomfort you are experiencing: °· Only take over-the-counter or prescription medicines as directed by your health care provider. °· Do not take laxatives unless directed to do so by your health care provider. °· Try a clear liquid diet (broth, tea, or water) as directed by your health care provider. Slowly move to a bland diet as tolerated. °SEEK MEDICAL CARE IF: °· You have unexplained abdominal pain. °· You have abdominal pain associated with nausea or diarrhea. °· You have pain when you urinate or have a bowel movement. °· You experience abdominal pain that wakes you in the night. °· You have abdominal pain that is worsened or improved by eating food. °· You have abdominal pain that is worsened with eating fatty foods. °· You have a fever. °SEEK IMMEDIATE MEDICAL CARE IF:  °· Your pain does not go away within 2 hours. °· You keep throwing up (vomiting). °· Your pain is felt only in portions of the abdomen, such as the right side or the left lower portion of the abdomen. °· You pass bloody or black tarry stools. °MAKE SURE YOU: °· Understand these instructions.   °· Will watch your condition.   °· Will get help right away if you are not doing well or get worse.   °Document Released: 06/28/2005 Document Revised: 09/23/2013 Document Reviewed: 05/28/2013 °ExitCare® Patient Information  ©2015 [employer]. This information is not intended to replace advice given to you by your health care provider. Make sure you discuss any questions you have with your health care provider. °Urinary Tract Infection °Urinary tract infections (UTIs) can develop anywhere along your urinary tract. Your urinary tract is your body's drainage system for removing wastes and extra water. Your urinary tract includes two kidneys, two ureters, a bladder, and a urethra. Your kidneys are a pair of bean-shaped organs. Each kidney is about the size of your fist. They are located below your ribs, one on each side of your spine. °CAUSES °Infections are caused by microbes, which are microscopic organisms, including fungi, viruses, and bacteria. These organisms are so small that they can only be seen through a microscope. Bacteria are the microbes that most commonly cause UTIs. °SYMPTOMS  °Symptoms of UTIs may vary by age and gender of the patient and by the location of the infection. Symptoms in young women typically include a frequent and intense urge to urinate and a painful, burning feeling in the bladder or urethra during urination. Older women and men are more likely to be tired, shaky, and weak and have muscle aches and abdominal pain. A fever may mean the infection is in your kidneys. Other symptoms of a kidney infection include pain in your back or sides below the ribs, nausea, and vomiting. °DIAGNOSIS °To diagnose a UTI, your caregiver will ask you about your symptoms. Your caregiver also will ask to provide a urine sample. The urine sample   will be tested for bacteria and white blood cells. White blood cells are made by your body to help fight infection. °TREATMENT  °Typically, UTIs can be treated with medication. Because most UTIs are caused by a bacterial infection, they usually can be treated with the use of antibiotics. The choice of antibiotic and length of treatment depend on your symptoms and the type of bacteria  causing your infection. °HOME CARE INSTRUCTIONS °· If you were prescribed antibiotics, take them exactly as your caregiver instructs you. Finish the medication even if you feel better after you have only taken some of the medication. °· Drink enough water and fluids to keep your urine clear or pale yellow. °· Avoid caffeine, tea, and carbonated beverages. They tend to irritate your bladder. °· Empty your bladder often. Avoid holding urine for long periods of time. °· Empty your bladder before and after sexual intercourse. °· After a bowel movement, women should cleanse from front to back. Use each tissue only once. °SEEK MEDICAL CARE IF:  °· You have back pain. °· You develop a fever. °· Your symptoms do not begin to resolve within 3 days. °SEEK IMMEDIATE MEDICAL CARE IF:  °· You have severe back pain or lower abdominal pain. °· You develop chills. °· You have nausea or vomiting. °· You have continued burning or discomfort with urination. °MAKE SURE YOU:  °· Understand these instructions. °· Will watch your condition. °· Will get help right away if you are not doing well or get worse. °Document Released: 06/28/2005 Document Revised: 03/19/2012 Document Reviewed: 10/27/2011 °ExitCare® Patient Information ©2015 [employer]. This information is not intended to replace advice given to you by your health care provider. Make sure you discuss any questions you have with your health care provider. ° °

## 2015-02-02 NOTE — ED Provider Notes (Signed)
CSN: [id number]     Arrival date & time 02/02/15  1808 History  First MD Initiated Contact with Patient 02/02/15 1904     Chief Complaint  Patient presents with  . Abdominal Pain   Patient is a 24 y.o. female presenting with abdominal pain. The history is provided by the patient.  Abdominal Pain Pain location:  RUQ Pain quality: sharp   Pain radiates to:  Does not radiate Pain severity:  Moderate Onset quality:  Gradual Duration:  3 days Timing:  Intermittent Relieved by:   Worsened by:  Deep breathing (coughing, movement , eating does not affect it) Ineffective treatments:  None tried Associated symptoms: no diarrhea, no fever and no vomiting     Past Medical History  Diagnosis Date  . Anemia   . Chlamydia   . Headache(784.0)   . Hypertension     no meds  . Pregnancy induced hypertension    Past Surgical History  Procedure Laterality Date  . Hand surgery     Family History  Problem Relation Age of Onset  . Hypertension    . Diabetes     History  Substance Use Topics  . Smoking status: Former Smoker    Types: Cigarettes  . Smokeless tobacco: Never Used  . Alcohol Use: No     Comment: early pregnancy   OB History    Gravida Para Term Preterm AB TAB SAB Ectopic Multiple Living   0 1 0 0 0 0 3     Review of Systems  Constitutional: Negative for fever.  Gastrointestinal: Positive for abdominal pain. Negative for vomiting and diarrhea.  All other systems reviewed and are negative.     Allergies  Review of patient's allergies indicates no known allergies.  Home Medications   Prior to Admission medications   Medication Sig Start Date End Date Taking? Authorizing Provider  acetaminophen (TYLENOL) 500 MG tablet Take 1,000 mg by mouth every 6 (six) hours as needed for mild pain.   Yes Historical Provider, MD  Aspirin-Acetaminophen (GOODYS BODY PAIN PO) Take 1 packet by mouth 2 (two) times daily as needed (for pain and cramping).   Yes Historical  Provider, MD  PRESCRIPTION MEDICATION Take 1 tablet by mouth daily. Oral contraceptive   Yes Historical Provider, MD  HYDROcodone-acetaminophen (NORCO/VICODIN) 5-325 MG per tablet Take 1-2 tablets by mouth every 4 (four) hours as needed. 02/02/15   Linwood Dibbles, MD  Iron-FA-B Cmp-C-Biot-Probiotic (FUSION PLUS) CAPS Take 1 capsule by mouth daily before breakfast. Patient not taking: Reported on 01/13/2015 04/19/13   Brock Bad, MD  naproxen (NAPROSYN) 500 MG tablet Take 1 tablet (500 mg total) by mouth 2 (two) times daily. 02/02/15   Linwood Dibbles, MD   BP 95/55 mmHg  Pulse 68  Temp(Src) 98.3 F (36.8 C) (Oral)  Resp 18  SpO2 100%  LMP 01/27/2015 (Approximate)  Breastfeeding? No Physical Exam  Constitutional: She appears well-developed and well-nourished. No distress.  HENT:  Head: Normocephalic and atraumatic.  Right Ear: External ear normal.  Left Ear: External ear normal.  Eyes: Conjunctivae are normal. Right eye exhibits no discharge. Left eye exhibits no discharge. No scleral icterus.  Neck: Neck supple. No tracheal deviation present.  Cardiovascular: Normal rate, regular rhythm and intact distal pulses.   Pulmonary/Chest: Effort normal and breath sounds normal. No stridor. No respiratory distress. She has no wheezes. She has no rales.  Abdominal: Soft. Bowel sounds are normal. She exhibits no distension. There is tenderness in  the right upper quadrant. There is no rigidity, no rebound and no guarding. No hernia.  Musculoskeletal: She exhibits no edema or tenderness.  Neurological: She is alert. She has normal strength. No cranial nerve deficit (no facial droop, extraocular movements intact, no slurred speech) or sensory deficit. She exhibits normal muscle tone. She displays no seizure activity. Coordination normal.  Skin: Skin is warm and dry. No rash noted.  Psychiatric: She has a normal mood and affect.  Nursing note and vitals reviewed.   ED Course  Procedures (including critical  care time) Labs Review Labs Reviewed  CBC WITH DIFFERENTIAL/PLATELET - Abnormal; Notable for the following:    RBC 3.77 (*)    Hemoglobin 8.6 (*)    HCT 25.9 (*)    MCV 68.7 (*)    MCH 22.8 (*)    RDW 16.3 (*)    All other components within normal limits  COMPREHENSIVE METABOLIC PANEL - Abnormal; Notable for the following:    AST 11 (*)    ALT 12 (*)    Total Bilirubin 0.2 (*)    All other components within normal limits  LIPASE, BLOOD - Abnormal; Notable for the following:    Lipase 20 (*)    All other components within normal limits  URINALYSIS, ROUTINE W REFLEX MICROSCOPIC - Abnormal; Notable for the following:    APPearance CLOUDY (*)    Nitrite POSITIVE (*)    All other components within normal limits  URINE MICROSCOPIC-ADD ON - Abnormal; Notable for the following:    Bacteria, UA MANY (*)    All other components within normal limits  I-STAT BETA HCG BLOOD, ED ([facility], [facility], [facility] ONLY) - Abnormal; Notable for the following:    I-stat hCG, quantitative 8.3 (*)    All other components within normal limits  POC URINE PREG, ED    Imaging Review Dg Chest 2 View  02/02/2015   CLINICAL DATA:  Lower abdominal pain and nausea for 3 days  EXAM: CHEST  2 VIEW  COMPARISON:  None.  FINDINGS: Normal mediastinum and cardiac silhouette. Normal pulmonary vasculature. No evidence of effusion, infiltrate, or pneumothorax. No acute bony abnormality.  IMPRESSION: Normal chest radiograph   Electronically Signed   By: Genevive BiStewart  Edmunds M.D.   On: 02/02/2015 20:19   [place] Abdomen Limited Ruq  02/02/2015   CLINICAL DATA:  Right upper quadrant pain for 4 days  EXAM: US ABDOMEN LIMITED - RIGHT UPPER QUADRANT  COMPARISON:  None.  FINDINGS: Gallbladder:  No gallstones or wall thickening visualized. No sonographic Murphy sign noted.  Common bile duct:  Diameter: 4.7 mm  Liver:  No focal lesion identified. Within normal limits in parenchymal echogenicity.  IMPRESSION: Normal   Electronically Signed   By: Ellery Plunkaniel R  Mitchell M.D.   On: 02/02/2015 21:19   Medications  0.9 %  sodium chloride infusion (0 mLs Intravenous Stopped 02/02/15 2021)    Followed by  0.9 %  sodium chloride infusion (1,000 mLs Intravenous New Bag/Given 02/02/15 2021)  HYDROmorphone (DILAUDID) injection 0.5 mg (0.5 mg Intravenous Given 02/02/15 2020)  ondansetron (ZOFRAN) injection 4 mg (4 mg Intravenous Given 02/02/15 1935)     MDM   Final diagnoses:  Abdominal pain, acute    Patient's laboratory tests are reassuring. She does have bacteria in the urine and positive nitrites. I will cover her for urinary tract infection although I doubt pyelonephritis.  Ultrasound does not show any signs of gallstones. Patient does have tenderness in the right chest wall and  some these symptoms may be musculoskeletal.  discharged home with medications for pain and oral antibiotics. Follow-up with primary care doctor    Linwood Dibbles, MD 02/08/15 1910

## 2015-06-02 DIAGNOSIS — R109 Unspecified abdominal pain: Secondary | ICD-10-CM | POA: Insufficient documentation

## 2015-06-02 DIAGNOSIS — I10 Essential (primary) hypertension: Secondary | ICD-10-CM | POA: Insufficient documentation

## 2015-06-03 ENCOUNTER — Encounter (HOSPITAL_COMMUNITY): Payer: Self-pay | Admitting: *Deleted

## 2015-06-03 ENCOUNTER — Emergency Department (HOSPITAL_COMMUNITY)
Admission: EM | Admit: 2015-06-03 | Discharge: 2015-06-03 | Discharge status: Against Medical Advice | Payer: Medicaid Other | Attending: Emergency Medicine | Admitting: Emergency Medicine

## 2015-06-03 LAB — CBC
HCT: 29.1 % — ABNORMAL LOW (ref 36.0–46.0)
HEMOGLOBIN: 9.6 g/dL — AB (ref 12.0–15.0)
MCH: 22.2 pg — AB (ref 26.0–34.0)
MCHC: 33 g/dL (ref 30.0–36.0)
MCV: 67.2 fL — AB (ref 78.0–100.0)
Platelets: 386 10*3/uL (ref 150–400)
RBC: 4.33 MIL/uL (ref 3.87–5.11)
RDW: 21.8 % — ABNORMAL HIGH (ref 11.5–15.5)
WBC: 8.4 10*3/uL (ref 4.0–10.5)

## 2015-06-03 LAB — URINALYSIS, ROUTINE W REFLEX MICROSCOPIC
Bilirubin Urine: NEGATIVE
Glucose, UA: NEGATIVE mg/dL
Hgb urine dipstick: NEGATIVE
Ketones, ur: NEGATIVE mg/dL
Nitrite: NEGATIVE
PH: 7.5 (ref 5.0–8.0)
PROTEIN: NEGATIVE mg/dL
Specific Gravity, Urine: 1.024 (ref 1.005–1.030)
Urobilinogen, UA: 1 mg/dL (ref 0.0–1.0)

## 2015-06-03 LAB — COMPREHENSIVE METABOLIC PANEL
ALT: 16 U/L (ref 14–54)
ANION GAP: 7 (ref 5–15)
AST: 18 U/L (ref 15–41)
Albumin: 3.9 g/dL (ref 3.5–5.0)
Alkaline Phosphatase: 58 U/L (ref 38–126)
BUN: 9 mg/dL (ref 6–20)
CHLORIDE: 106 mmol/L (ref 101–111)
CO2: 26 mmol/L (ref 22–32)
Calcium: 9.1 mg/dL (ref 8.9–10.3)
Creatinine, Ser: 0.7 mg/dL (ref 0.44–1.00)
GFR calc Af Amer: >60 mL/min (ref >60)
GFR calc non Af Amer: >60 mL/min (ref >60)
Glucose, Bld: 91 mg/dL (ref 65–99)
Potassium: 3.7 mmol/L (ref 3.5–5.1)
SODIUM: 139 mmol/L (ref 135–145)
Total Bilirubin: 0.3 mg/dL (ref 0.3–1.2)
Total Protein: 7.9 g/dL (ref 6.5–8.1)

## 2015-06-03 LAB — URINE MICROSCOPIC-ADD ON

## 2015-06-03 LAB — POC URINE PREG, ED: Preg Test, Ur: NEGATIVE

## 2015-06-03 LAB — LIPASE, BLOOD: LIPASE: 19 U/L — AB (ref 22–51)

## 2015-06-03 NOTE — ED Notes (Signed)
No answer when called 

## 2015-06-03 NOTE — ED Notes (Signed)
Patient presents stating about 2 hours ago started with right side abd pain

## 2015-06-25 ENCOUNTER — Encounter (HOSPITAL_COMMUNITY): Payer: Self-pay | Admitting: Emergency Medicine

## 2015-06-25 ENCOUNTER — Emergency Department (HOSPITAL_COMMUNITY)
Admission: EM | Admit: 2015-06-25 | Discharge: 2015-06-26 | Disposition: A | Discharge status: Home / Self Care | Payer: Medicaid Other | Attending: Emergency Medicine | Admitting: Emergency Medicine

## 2015-06-25 DIAGNOSIS — W228XXA Striking against or struck by other objects, initial encounter: Secondary | ICD-10-CM | POA: Insufficient documentation

## 2015-06-25 DIAGNOSIS — Y9389 Activity, other specified: Secondary | ICD-10-CM | POA: Insufficient documentation

## 2015-06-25 DIAGNOSIS — Y998 Other external cause status: Secondary | ICD-10-CM | POA: Insufficient documentation

## 2015-06-25 DIAGNOSIS — S0003XA Contusion of scalp, initial encounter: Secondary | ICD-10-CM | POA: Insufficient documentation

## 2015-06-25 DIAGNOSIS — Y92008 Other place in unspecified non-institutional (private) residence as the place of occurrence of the external cause: Secondary | ICD-10-CM | POA: Insufficient documentation

## 2015-06-25 DIAGNOSIS — Z79899 Other long term (current) drug therapy: Secondary | ICD-10-CM | POA: Insufficient documentation

## 2015-06-25 DIAGNOSIS — Z87891 Personal history of nicotine dependence: Secondary | ICD-10-CM | POA: Insufficient documentation

## 2015-06-25 DIAGNOSIS — S060X1A Concussion with loss of consciousness of 30 minutes or less, initial encounter: Secondary | ICD-10-CM | POA: Insufficient documentation

## 2015-06-25 DIAGNOSIS — Z8619 Personal history of other infectious and parasitic diseases: Secondary | ICD-10-CM | POA: Insufficient documentation

## 2015-06-25 DIAGNOSIS — I10 Essential (primary) hypertension: Secondary | ICD-10-CM | POA: Insufficient documentation

## 2015-06-25 DIAGNOSIS — D649 Anemia, unspecified: Secondary | ICD-10-CM | POA: Insufficient documentation

## 2015-06-25 DIAGNOSIS — Z792 Long term (current) use of antibiotics: Secondary | ICD-10-CM | POA: Insufficient documentation
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
## 2015-06-26 ENCOUNTER — Emergency Department (HOSPITAL_COMMUNITY): Payer: Medicaid Other
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
## 2015-06-26 NOTE — Discharge Instructions (Signed)
Concussion  A concussion, or closed-head injury, is a brain injury caused by a direct blow to the head or by a quick and sudden movement (jolt) of the head or neck. Concussions are usually not life-threatening. Even so, the effects of a concussion can be serious. If you have had a concussion before, you are more likely to experience concussion-like symptoms after a direct blow to the head.   CAUSES  · Direct blow to the head, such as from running into another player during a soccer game, being hit in a fight, or hitting your head on a hard surface.  · A jolt of the head or neck that causes the brain to move back and forth inside the skull, such as in a car crash.  SIGNS AND SYMPTOMS  The signs of a concussion can be hard to notice. Early on, they may be missed by you, family members, and health care providers. You may look fine but act or feel differently.  Symptoms are usually temporary, but they may last for days, weeks, or even longer. Some symptoms may appear right away while others may not show up for hours or days. Every head injury is different. Symptoms include:  · Mild to moderate headaches that will not go away.  · A feeling of pressure inside your head.  · Having more trouble than usual:  ¨ Learning or remembering things you have heard.  ¨ Answering questions.  ¨ Paying attention or concentrating.  ¨ Organizing daily tasks.  ¨ Making decisions and solving problems.  · Slowness in thinking, acting or reacting, speaking, or reading.  · Getting lost or being easily confused.  · Feeling tired all the time or lacking energy (fatigued).  · Feeling drowsy.  · Sleep disturbances.  ¨ Sleeping more than usual.  ¨ Sleeping less than usual.  ¨ Trouble falling asleep.  ¨ Trouble sleeping (insomnia).  · Loss of balance or feeling lightheaded or dizzy.  · Nausea or vomiting.  · Numbness or tingling.  · Increased sensitivity to:  ¨ Sounds.  ¨ Lights.  ¨ Distractions.  · Vision problems or eyes that tire  easily.  · Diminished sense of taste or smell.  · Ringing in the ears.  · Mood changes such as feeling sad or anxious.  · Becoming easily irritated or angry for little or no reason.  · Lack of motivation.  · Seeing or hearing things other people do not see or hear (hallucinations).  DIAGNOSIS  Your health care provider can usually diagnose a concussion based on a description of your injury and symptoms. He or she will ask whether you passed out (lost consciousness) and whether you are having trouble remembering events that happened right before and during your injury.  Your evaluation might include:  · A brain scan to look for signs of injury to the brain. Even if the test shows no injury, you may still have a concussion.  · Blood tests to be sure other problems are not present.  TREATMENT  · Concussions are usually treated in an emergency department, in urgent care, or at a clinic. You may need to stay in the hospital overnight for further treatment.  · Tell your health care provider if you are taking any medicines, including prescription medicines, over-the-counter medicines, and natural remedies. Some medicines, such as blood thinners (anticoagulants) and aspirin, may increase the chance of complications. Also tell your health care provider whether you have had alcohol or are taking illegal drugs. This information   may affect treatment.  · Your health care provider will send you home with important instructions to follow.  · How fast you will recover from a concussion depends on many factors. These factors include how severe your concussion is, what part of your brain was injured, your age, and how healthy you were before the concussion.  · Most people with mild injuries recover fully. Recovery can take time. In general, recovery is slower in older persons. Also, persons who have had a concussion in the past or have other medical problems may find that it takes longer to recover from their current injury.  HOME  CARE INSTRUCTIONS  General Instructions  · Carefully follow the directions your health care provider gave you.  · Only take over-the-counter or prescription medicines for pain, discomfort, or fever as directed by your health care provider.  · Take only those medicines that your health care provider has approved.  · Do not drink alcohol until your health care provider says you are well enough to do so. Alcohol and certain other drugs may slow your recovery and can put you at risk of further injury.  · If it is harder than usual to remember things, write them down.  · If you are easily distracted, try to do one thing at a time. For example, do not try to watch TV while fixing dinner.  · Talk with family members or close friends when making important decisions.  · Keep all follow-up appointments. Repeated evaluation of your symptoms is recommended for your recovery.  · Watch your symptoms and tell others to do the same. Complications sometimes occur after a concussion. Older adults with a brain injury may have a higher risk of serious complications, such as a blood clot on the brain.  · Tell your teachers, school nurse, school counselor, coach, athletic trainer, or work manager about your injury, symptoms, and restrictions. Tell them about what you can or cannot do. They should watch for:  ¨ Increased problems with attention or concentration.  ¨ Increased difficulty remembering or learning new information.  ¨ Increased time needed to complete tasks or assignments.  ¨ Increased irritability or decreased ability to cope with stress.  ¨ Increased symptoms.  · Rest. Rest helps the brain to heal. Make sure you:  ¨ Get plenty of sleep at night. Avoid staying up late at night.  ¨ Keep the same bedtime hours on weekends and weekdays.  ¨ Rest during the day. Take daytime naps or rest breaks when you feel tired.  · Limit activities that require a lot of thought or concentration. These include:  ¨ Doing homework or job-related  work.  ¨ Watching TV.  ¨ Working on the computer.  · Avoid any situation where there is potential for another head injury (football, hockey, soccer, basketball, martial arts, downhill snow sports and horseback riding). Your condition will get worse every time you experience a concussion. You should avoid these activities until you are evaluated by the appropriate follow-up health care providers.  Returning To Your Regular Activities  You will need to return to your normal activities slowly, not all at once. You must give your body and brain enough time for recovery.  · Do not return to sports or other athletic activities until your health care provider tells you it is safe to do so.  · Ask your health care provider when you can drive, ride a bicycle, or operate heavy machinery. Your ability to react may be slower after a   brain injury. Never do these activities if you are dizzy.  · Ask your health care provider about when you can return to work or school.  Preventing Another Concussion  It is very important to avoid another brain injury, especially before you have recovered. In rare cases, another injury can lead to permanent brain damage, brain swelling, or death. The risk of this is greatest during the first 7-10 days after a head injury. Avoid injuries by:  · Wearing a seat belt when riding in a car.  · Drinking alcohol only in moderation.  · Wearing a helmet when biking, skiing, skateboarding, skating, or doing similar activities.  · Avoiding activities that could lead to a second concussion, such as contact or recreational sports, until your health care provider says it is okay.  · Taking safety measures in your home.  ¨ Remove clutter and tripping hazards from floors and stairways.  ¨ Use grab bars in bathrooms and handrails by stairs.  ¨ Place non-slip mats on floors and in bathtubs.  ¨ Improve lighting in dim areas.  SEEK MEDICAL CARE IF:  · You have increased problems paying attention or  concentrating.  · You have increased difficulty remembering or learning new information.  · You need more time to complete tasks or assignments than before.  · You have increased irritability or decreased ability to cope with stress.  · You have more symptoms than before.  Seek medical care if you have any of the following symptoms for more than 2 weeks after your injury:  · Lasting (chronic) headaches.  · Dizziness or balance problems.  · Nausea.  · Vision problems.  · Increased sensitivity to noise or light.  · Depression or mood swings.  · Anxiety or irritability.  · Memory problems.  · Difficulty concentrating or paying attention.  · Sleep problems.  · Feeling tired all the time.  SEEK IMMEDIATE MEDICAL CARE IF:  · You have severe or worsening headaches. These may be a sign of a blood clot in the brain.  · You have weakness (even if only in one hand, leg, or part of the face).  · You have numbness.  · You have decreased coordination.  · You vomit repeatedly.  · You have increased sleepiness.  · One pupil is larger than the other.  · You have convulsions.  · You have slurred speech.  · You have increased confusion. This may be a sign of a blood clot in the brain.  · You have increased restlessness, agitation, or irritability.  · You are unable to recognize people or places.  · You have neck pain.  · It is difficult to wake you up.  · You have unusual behavior changes.  · You lose consciousness.  MAKE SURE YOU:  · Understand these instructions.  · Will watch your condition.  · Will get help right away if you are not doing well or get worse.  Document Released: 12/09/2003 Document Revised: 09/23/2013 Document Reviewed: 04/10/2013  [employer]® Patient Information ©2015 [employer]. This information is not intended to replace advice given to you by your health care provider. Make sure you discuss any questions you have with your health care provider.

## 2015-06-27 ENCOUNTER — Inpatient Hospital Stay (HOSPITAL_COMMUNITY)
Admission: AD | Admit: 2015-06-27 | Discharge: 2015-06-29 | DRG: 918 | Disposition: A | Discharge status: Home / Self Care | Payer: Federal, State, Local not specified - Other | Source: Intra-hospital | Attending: Psychiatry | Admitting: Psychiatry

## 2015-06-27 ENCOUNTER — Encounter (HOSPITAL_COMMUNITY): Payer: Self-pay | Admitting: *Deleted

## 2015-06-27 ENCOUNTER — Emergency Department (HOSPITAL_COMMUNITY)
Admission: EM | Admit: 2015-06-27 | Discharge: 2015-06-27 | Disposition: A | Discharge status: Other Acute Inpatient Hospital | Payer: Medicaid Other | Attending: Emergency Medicine | Admitting: Emergency Medicine

## 2015-06-27 ENCOUNTER — Encounter (HOSPITAL_COMMUNITY): Payer: Self-pay

## 2015-06-27 DIAGNOSIS — T424X1A Poisoning by benzodiazepines, accidental (unintentional), initial encounter: Secondary | ICD-10-CM | POA: Insufficient documentation

## 2015-06-27 DIAGNOSIS — F329 Major depressive disorder, single episode, unspecified: Secondary | ICD-10-CM | POA: Insufficient documentation

## 2015-06-27 DIAGNOSIS — G4700 Insomnia, unspecified: Secondary | ICD-10-CM | POA: Diagnosis present

## 2015-06-27 DIAGNOSIS — I10 Essential (primary) hypertension: Secondary | ICD-10-CM | POA: Diagnosis present

## 2015-06-27 DIAGNOSIS — Z833 Family history of diabetes mellitus: Secondary | ICD-10-CM | POA: Diagnosis not present

## 2015-06-27 DIAGNOSIS — T424X2A Poisoning by benzodiazepines, intentional self-harm, initial encounter: Secondary | ICD-10-CM | POA: Diagnosis present

## 2015-06-27 DIAGNOSIS — F1210 Cannabis abuse, uncomplicated: Secondary | ICD-10-CM | POA: Insufficient documentation

## 2015-06-27 DIAGNOSIS — F419 Anxiety disorder, unspecified: Secondary | ICD-10-CM | POA: Diagnosis present

## 2015-06-27 DIAGNOSIS — T424X4A Poisoning by benzodiazepines, undetermined, initial encounter: Secondary | ICD-10-CM

## 2015-06-27 DIAGNOSIS — D649 Anemia, unspecified: Secondary | ICD-10-CM | POA: Insufficient documentation

## 2015-06-27 DIAGNOSIS — T50904A Poisoning by unspecified drugs, medicaments and biological substances, undetermined, initial encounter: Secondary | ICD-10-CM | POA: Diagnosis present

## 2015-06-27 DIAGNOSIS — Z8249 Family history of ischemic heart disease and other diseases of the circulatory system: Secondary | ICD-10-CM

## 2015-06-27 DIAGNOSIS — F1910 Other psychoactive substance abuse, uncomplicated: Secondary | ICD-10-CM

## 2015-06-27 DIAGNOSIS — Z87891 Personal history of nicotine dependence: Secondary | ICD-10-CM | POA: Insufficient documentation

## 2015-06-27 DIAGNOSIS — F32A Depression, unspecified: Secondary | ICD-10-CM

## 2015-06-27 DIAGNOSIS — F1310 Sedative, hypnotic or anxiolytic abuse, uncomplicated: Secondary | ICD-10-CM | POA: Insufficient documentation

## 2015-06-27 DIAGNOSIS — Z8619 Personal history of other infectious and parasitic diseases: Secondary | ICD-10-CM | POA: Insufficient documentation

## 2015-06-27 LAB — COMPREHENSIVE METABOLIC PANEL
ALK PHOS: 54 U/L (ref 38–126)
ALT: 19 U/L (ref 14–54)
AST: 18 U/L (ref 15–41)
Albumin: 4 g/dL (ref 3.5–5.0)
Anion gap: 4 — ABNORMAL LOW (ref 5–15)
BUN: 8 mg/dL (ref 6–20)
CO2: 24 mmol/L (ref 22–32)
CREATININE: 0.62 mg/dL (ref 0.44–1.00)
Calcium: 8.8 mg/dL — ABNORMAL LOW (ref 8.9–10.3)
Chloride: 110 mmol/L (ref 101–111)
Glucose, Bld: 90 mg/dL (ref 65–99)
Potassium: 3.8 mmol/L (ref 3.5–5.1)
SODIUM: 138 mmol/L (ref 135–145)
Total Bilirubin: 0.3 mg/dL (ref 0.3–1.2)
Total Protein: 7.9 g/dL (ref 6.5–8.1)

## 2015-06-27 LAB — CBC
HCT: 27 % — ABNORMAL LOW (ref 36.0–46.0)
HEMOGLOBIN: 9 g/dL — AB (ref 12.0–15.0)
MCH: 22 pg — AB (ref 26.0–34.0)
MCHC: 33.3 g/dL (ref 30.0–36.0)
MCV: 65.9 fL — AB (ref 78.0–100.0)
Platelets: 301 10*3/uL (ref 150–400)
RBC: 4.1 MIL/uL (ref 3.87–5.11)
RDW: 20.1 % — ABNORMAL HIGH (ref 11.5–15.5)
WBC: 5.8 10*3/uL (ref 4.0–10.5)

## 2015-06-27 LAB — RAPID URINE DRUG SCREEN, HOSP PERFORMED
Amphetamines: NOT DETECTED
BARBITURATES: NOT DETECTED
BENZODIAZEPINES: POSITIVE — AB
COCAINE: NOT DETECTED
Opiates: NOT DETECTED
TETRAHYDROCANNABINOL: POSITIVE — AB

## 2015-06-27 LAB — ACETAMINOPHEN LEVEL: Acetaminophen (Tylenol), Serum: <10 ug/mL — ABNORMAL LOW (ref 10–30)

## 2015-06-27 LAB — SALICYLATE LEVEL: Salicylate Lvl: <4 mg/dL (ref 2.8–30.0)

## 2015-06-27 LAB — ETHANOL: Alcohol, Ethyl (B): <5 mg/dL (ref <5)

## 2015-06-27 MED ORDER — INFLUENZA VAC SPLIT QUAD 0.5 ML IM SUSY
0.5000 mL | PREFILLED_SYRINGE | INTRAMUSCULAR | Status: AC
  Administered 2015-06-28: 0.5 mL via INTRAMUSCULAR
  Filled 2015-06-27: qty 0.5

## 2015-06-27 MED ORDER — TRAZODONE HCL 50 MG PO TABS
50.0000 mg | ORAL_TABLET | Freq: Every evening | ORAL | Status: DC | PRN
  Administered 2015-06-27 – 2015-06-28 (×2): 50 mg via ORAL
  Filled 2015-06-27: qty 1
  Filled 2015-06-27: qty 14
  Filled 2015-06-27 (×2): qty 1

## 2015-06-27 MED ORDER — ALUM & MAG HYDROXIDE-SIMETH 200-200-20 MG/5ML PO SUSP: 30.0000 mL | ORAL | Status: DC | PRN

## 2015-06-27 MED ORDER — ACETAMINOPHEN 325 MG PO TABS
650.0000 mg | ORAL_TABLET | Freq: Four times a day (QID) | ORAL | Status: DC | PRN
  Administered 2015-06-28 – 2015-06-29 (×2): 650 mg via ORAL
  Filled 2015-06-27 (×2): qty 2

## 2015-06-27 MED ORDER — MAGNESIUM HYDROXIDE 400 MG/5ML PO SUSP: 30.0000 mL | Freq: Every day | ORAL | Status: DC | PRN

## 2015-06-27 MED ORDER — IBUPROFEN 200 MG PO TABS
600.0000 mg | ORAL_TABLET | Freq: Three times a day (TID) | ORAL | Status: DC | PRN
Start: 2015-06-27 — End: 2015-06-27

## 2015-06-27 MED ORDER — ONDANSETRON HCL 4 MG PO TABS: 4.0000 mg | ORAL_TABLET | Freq: Three times a day (TID) | ORAL | Status: DC | PRN

## 2015-06-27 MED ORDER — PNEUMOCOCCAL VAC POLYVALENT 25 MCG/0.5ML IJ INJ
0.5000 mL | INJECTION | INTRAMUSCULAR | Status: AC
  Administered 2015-06-28: 0.5 mL via INTRAMUSCULAR

## 2015-06-27 NOTE — ED Notes (Signed)
She states she took "a lot of Xanax tablets all day long yesterday".  When I asked why she did it she stated "because I ain't got nobody". [sic]  She is awake and mildly drowsy and is oriented x 4 and in no distress.  She is tearful.

## 2015-06-27 NOTE — ED Notes (Signed)
Pt awaken and stated that she was ready to leave. She stated that her grandmother has her kids at home and was unable to care for them and she needed to leave. Pt stated that she took the Xanax yesterday and they have worn off and she is fine now. Pt denies SI/HI.
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
## 2015-06-27 NOTE — ED Notes (Addendum)
Awake. Verbally responsive.Pt given meal tray. Resp even and unlabored. ABC's intact. No behavior problems noted. NAD noted. Sitter at bedside.

## 2015-06-27 NOTE — Tx Team (Signed)
Initial Interdisciplinary Treatment Plan   PATIENT STRESSORS: Loss of relationship with boyfriend Marital or family conflict   PATIENT STRENGTHS: Average or above average intelligence Communication skills General fund of knowledge   PROBLEM LIST: Problem List/Patient Goals Date to be addressed Date deferred Reason deferred Estimated date of resolution  "stop being a crybaby and know how to handle my situation"  06/27/15     Learn new coping skills 06/27/15                                                DISCHARGE CRITERIA:  Improved stabilization in mood, thinking, and/or behavior  PRELIMINARY DISCHARGE PLAN: Return to previous living arrangement Return to previous work or school arrangements  PATIENT/FAMIILY INVOLVEMENT: This treatment plan has been presented to and reviewed with the patient, Virginia Crews.  The patient and family have been given the opportunity to ask questions and make suggestions.  Arrie Aran 06/27/2015, 10:31 PM

## 2015-06-27 NOTE — ED Notes (Signed)
Resting quietly with eye closed. Easily arousable. Verbally responsive. Resp even and unlabored. ABC's intact. No behavior problems noted. NAD noted. Sitter at bedside.  

## 2015-06-27 NOTE — ED Notes (Signed)
Bed: WU98 Expected date:  Expected time:  Means of arrival:  Comments: EMS O.D. Xanax/ETOH

## 2015-06-27 NOTE — ED Provider Notes (Signed)
CSN: [id number]     Arrival date & time 06/27/15  1244 History   First MD Initiated Contact with Patient 06/27/15 1258     Chief Complaint  Patient presents with  . Suicidal     (Consider location/radiation/quality/duration/timing/severity/associated sxs/prior Treatment) HPI  Pt presenting with c/o taking xanax and drinking alcohol.  She states that last night she took 5 xanax pills and was drinking with it.  She states she was thinking of suicide but would not have intentionally committed suicide.  She is tearful and states that she has nobody in her life.  She states she slept all night and this morning, when she woke up she felt like she couldn't move her legs so she called the ambulance.  At this time she feels she can move her legs normally, feels somewhat sleepy.  Denies HI.  Denies other substances.  There are no other associated systemic symptoms, there are no other alleviating or modifying factors.   Past Medical History  Diagnosis Date  . Anemia   . Chlamydia   . Headache(784.0)   . Hypertension     no meds  . Pregnancy induced hypertension    Past Surgical History  Procedure Laterality Date  . Hand surgery     Family History  Problem Relation Age of Onset  . Hypertension    . Diabetes     Social History  Substance Use Topics  . Smoking status: Former Smoker    Types: Cigarettes  . Smokeless tobacco: Never Used  . Alcohol Use: No   OB History    Gravida Para Term Preterm AB TAB SAB Ectopic Multiple Living   0 1 0 0 0 0 3     Review of Systems  ROS reviewed and all otherwise negative except for mentioned in HPI    Allergies  Review of patient's allergies indicates no known allergies.  Home Medications   Prior to Admission medications   Medication Sig Start Date End Date Taking? Authorizing Provider  acetaminophen (TYLENOL) 500 MG tablet Take 1,000 mg by mouth every 6 (six) hours as needed for mild pain.   Yes Historical Provider, MD   Aspirin-Acetaminophen (GOODYS BODY PAIN PO) Take 1 packet by mouth 2 (two) times daily as needed (for pain and cramping).   Yes Historical Provider, MD  PRESCRIPTION MEDICATION Take 1 tablet by mouth daily. Oral contraceptive   Yes Historical Provider, MD  cephALEXin (KEFLEX) 500 MG capsule Take 1 capsule (500 mg total) by mouth 4 (four) times daily. Patient not taking: Reported on 06/26/2015 02/02/15   Linwood Dibbles, MD  HYDROcodone-acetaminophen (NORCO/VICODIN) 5-325 MG per tablet Take 1-2 tablets by mouth every 4 (four) hours as needed. Patient not taking: Reported on 06/26/2015 02/02/15   Linwood Dibbles, MD  Iron-FA-B Cmp-C-Biot-Probiotic (FUSION PLUS) CAPS Take 1 capsule by mouth daily before breakfast. Patient not taking: Reported on 01/13/2015 04/19/13   Brock Bad, MD  naproxen (NAPROSYN) 500 MG tablet Take 1 tablet (500 mg total) by mouth 2 (two) times daily. Patient not taking: Reported on 06/26/2015 02/02/15   Linwood Dibbles, MD   BP 105/64 mmHg  Pulse 81  Temp(Src) 99 F (37.2 C) (Oral)  Resp 18  SpO2 98%  LMP 06/24/2015 (Exact Date)  Vitals reviewed Physical Exam  Physical Examination: General appearance - alert, well appearing, and in no distress Mental status - alert, oriented to person, place, and time Eyes - pupils equal and reactive, extraocular eye movements intact Mouth -  mucous membranes moist, pharynx normal without lesions Chest - clear to auscultation, no wheezes, rales or rhonchi, symmetric air entry Heart - normal rate, regular rhythm, normal S1, S2, no murmurs, rubs, clicks or gallops Neurological - alert, oriented, normal speech Extremities - peripheral pulses normal, no pedal edema, no clubbing or cyanosis Skin - normal coloration and turgor, no rashes Psych- tearful, flat affect, cooperative  ED Course  Procedures (including critical care time) Labs Review Labs Reviewed  CBC - Abnormal; Notable for the following:    Hemoglobin 9.0 (*)    HCT 27.0 (*)    MCV 65.9 (*)     MCH 22.0 (*)    RDW 20.1 (*)    All other components within normal limits  COMPREHENSIVE METABOLIC PANEL - Abnormal; Notable for the following:    Calcium 8.8 (*)    Anion gap 4 (*)    All other components within normal limits  ACETAMINOPHEN LEVEL - Abnormal; Notable for the following:    Acetaminophen (Tylenol), Serum <10 (*)    All other components within normal limits  URINE RAPID DRUG SCREEN, HOSP PERFORMED - Abnormal; Notable for the following:    Benzodiazepines POSITIVE (*)    Tetrahydrocannabinol POSITIVE (*)    All other components within normal limits  ETHANOL  SALICYLATE LEVEL    Imaging Review  I have personally reviewed and evaluated these images and lab results as part of my medical decision-making.   EKG Interpretation None      MDM   Final diagnoses:  Polysubstance abuse  Depression    Pt presenting with c/o overdosing on xanax last night- took 5 pills with alcohol.  Pt is medically cleared.  She endorses some passive suicidal thoughts.  No HI.  Pt is medically cleared will have TTS evaluate patient.  TTS has evaluated patient and is planning to seek inpatient admission.    3:00 PM labs reviewed, pt is now medically cleared  Jerelyn Scott, MD 06/27/15 [phone]

## 2015-06-27 NOTE — ED Notes (Signed)
BELONGINGS REMOVED FROM PT'S ROOM AND PLACED IN NURSE'S STATION

## 2015-06-27 NOTE — ED Notes (Signed)
Pt is awake and alert, pt is requesting to be discharged. Pt states she is ready to go and was not SI. States she has been taken xanax at the age of 54. Reports was just partying and was not a OD attempted. Notified EPD and TTS.  IVc'd started, pt agreed to go voluntary. Safety monitored and maintained.. Pt transfer by [facility].  TlewisRN

## 2015-06-27 NOTE — ED Notes (Signed)
PSYCH TRIAGE PRESENT TO SPEAK WITH PT

## 2015-06-27 NOTE — Progress Notes (Signed)
Disposition CSW completed patient referrals to the following inpatient psych facilities:  [facility]  CSW will continue to follow patient for placement.  Seward Speck [facility] Disposition CSW [phone]

## 2015-06-27 NOTE — ED Notes (Signed)
Resting quietly with eye closed. Easily arousable. Verbally responsive. Resp even and unlabored. ABC's intact. No behavior problems noted. NAD noted. Sitter at bedside. Pt denies SI/HI and hallucinations

## 2015-06-28 DIAGNOSIS — T424X4A Poisoning by benzodiazepines, undetermined, initial encounter: Secondary | ICD-10-CM

## 2015-06-28 DIAGNOSIS — F329 Major depressive disorder, single episode, unspecified: Secondary | ICD-10-CM

## 2015-06-28 MED ORDER — NAPROXEN 500 MG PO TABS
500.0000 mg | ORAL_TABLET | Freq: Two times a day (BID) | ORAL | Status: DC | PRN
  Administered 2015-06-28: 500 mg via ORAL
  Filled 2015-06-28: qty 1

## 2015-06-28 MED ORDER — HYDROXYZINE HCL 25 MG PO TABS
25.0000 mg | ORAL_TABLET | Freq: Four times a day (QID) | ORAL | Status: DC | PRN
  Filled 2015-06-28: qty 6

## 2015-06-28 NOTE — H&P (Signed)
Psychiatric Admission Assessment Adult  Patient Identification: Meagan [name] MRN:  [id number] Date of Evaluation:  06/28/2015 Chief Complaint:   " I don't need to be here" Principal Diagnosis:  BZD Overdose   Diagnosis:   Patient Active Problem List   Diagnosis Date Noted  . Major depressive disorder, single episode [F32.9] 06/27/2015   History of Present Illness:: 24 year old female. States that last week had a TBI- concussion at work last week.  States that this might have influenced her decision ( by causing her to feel impulsive)  to go out with  " some people I really should not be hanging out with". States " they talked me into taking some Xanax, which I took, and I started feeling sick". States that after waking up  The next morning , she felt she could not see out of her R eye , which led " me to flip out". States she called police and they " assumed that I  had suicidal ideations"  , and brought her to hospital. Patient reports, " I was not suicidal, I just made a stupid mistake  By taking that Xanax ".  Chart notes indicate that patient reported taking several xanax tablets with alcohol  prior to admission , and to presenting tearful, sad,  Ruminative about poor support network/ feeling lonely and endorsing passive SI.  Of note, UDS positive for BZDs and Cannabis, BAL < 5 .  Patient minimizes the above, but does state that intoxication with alcohol/xanax likely contributed to her initial presentation.  At this time denies feeling depressed, denies any suicidal ideations , denies neuro vegetative symptoms, and is focused on being discharged soon, so she can reunite with her children , start working again, and minimizing risk of her electricity being cut off .   Elements:  Patient minimizes prior history . Presents status post BZD/Alcohol use. Her report at this time is different from initial information in ED. She is currently denying suicidal intent, or current depression.    Associated Signs/Symptoms: Depression Symptoms:   Denies any current symptoms of depression- denies anhedonia, denies sadness, describes good sense of self esteem, normal appetite, and normal sleep pattern.  (Hypo) Manic Symptoms:   Denies  Anxiety Symptoms:  Denies any panic or agoraphobia, denies excessive worrying  Psychotic Symptoms:   Denies  PTSD Symptoms: Denies any PTSD symptoms Total Time spent with patient: 45 minutes   Psychiatric History- This is first psychiatric admission, has never attempted suicide , denies history of cutting, denies history denies history of mania, denies any history of PTSD, denies history of GAD or Social Anxiety. Has never been on a psychiatric medication.   Past Medical History: states HTN only when pregnant , NKDA, does not smoke . Chronic Anemia .  Past Medical History  Diagnosis Date  . Anemia   . Chlamydia   . Headache(784.0)   . Hypertension     no meds  . Pregnancy induced hypertension     Past Surgical History  Procedure Laterality Date  . Hand surgery     Family History: Parents alive, separated, close to mother, has 20 brothers , 3 sisters, denies any history of mental illness, denies any history of suicides in family.  Denies alcohol or drug abuse history on family. Family History  Problem Relation Age of Onset  . Hypertension    . Diabetes     Social History:  Lives with her three children, 6,5,2 ( all boys ), works two jobs, also  in college . States children have day care through a voucher program.  Denies any legal issues . Recently broke up with BF, but states she is really not very concerned about this at present .  History  Alcohol Use No     History  Drug Use No    Social History   Social History  . Marital Status: Single    Spouse Name: N/A  . Number of Children: 2  . Years of Education: N/A   Occupational History  .     Social History Main Topics  . Smoking status: Former Smoker    Types: Cigarettes  .  Smokeless tobacco: Never Used  . Alcohol Use: No  . Drug Use: No  . Sexual Activity:    Partners: Male    Birth Control/ Protection: Pill   Other Topics Concern  . None   Social History Narrative   Additional Social History:    Pain Medications: denies Prescriptions: pt reports she has used Xanax "just took a couple this week, only one when I took it" Over the Counter: denies History of alcohol / drug use?: No history of alcohol / drug abuse Longest period of sobriety (when/how long): pt denies regular alcohol/drug use     Musculoskeletal: Strength & Muscle Tone: within normal limits Gait & Station: normal Patient leans: N/A  Psychiatric Specialty Exam: Physical Exam  Review of Systems  Constitutional: Negative.   HENT: Negative.  Negative for tinnitus.   Eyes: Negative.        States eye sight is much better, and denies amaurosis.   Respiratory: Negative.   Cardiovascular: Negative.   Gastrointestinal: Negative.   Genitourinary: Negative.   Musculoskeletal: Negative.   Skin: Negative.   Neurological: Negative for dizziness and seizures.  Endo/Heme/Allergies: Negative.   Psychiatric/Behavioral:       At this time minimizes depression, denies anxiety   all other systems negative   Blood pressure 108/77, pulse 124, temperature 98.3 F (36.8 C), temperature source Oral, resp. rate 16, height 5' 5.5" (1.664 m), weight 143 lb (64.864 kg), last menstrual period 06/27/2015.Body mass index is 23.43 kg/(m^2).  General Appearance: Fairly Groomed  Engineer, water::  Good  Speech:  Normal Rate  Volume:  Normal  Mood:  denies depression, states mood is " normal"  Affect:  appropriate, slightly irritable   Thought Process:  Goal Directed and Linear  Orientation:  Full (Time, Place, and Person)  Thought Content:  denies any hallucinations, not internally preuccupied,  no delusions  Suicidal Thoughts:  No- at this time denies any self injurious or suicidal ideations , and  contracts for safety on unit   Homicidal Thoughts:  No  Memory:  recent and remote grossly intact   Judgement:  Fair  Insight:  Fair  Psychomotor Activity:  Normal  Concentration:  Good  Recall:  Good  Fund of Knowledge:Good  Language: Good  Akathisia:  Negative  Handed:  Left  AIMS (if indicated):     Assets:  Communication Skills Desire for Improvement Resilience  ADL's:   Improved   Cognition: WNL  Sleep:  Number of Hours: 6   Risk to Self: Is patient at risk for suicide?: No Risk to Others:   Prior Inpatient Therapy:   Prior Outpatient Therapy:    Alcohol Screening: 1. How often do you have a drink containing alcohol?: Monthly or less 2. How many drinks containing alcohol do you have on a typical day when you are drinking?: 3 or 4  3. How often do you have six or more drinks on one occasion?: Never Preliminary Score: 1 4. How often during the last year have you found that you were not able to stop drinking once you had started?: Never 5. How often during the last year have you failed to do what was normally expected from you becasue of drinking?: Less than monthly 6. How often during the last year have you needed a first drink in the morning to get yourself going after a heavy drinking session?: Never 7. How often during the last year have you had a feeling of guilt of remorse after drinking?: Less than monthly 8. How often during the last year have you been unable to remember what happened the night before because you had been drinking?: Never 9. Have you or someone else been injured as a result of your drinking?: No 10. Has a relative or friend or a doctor or another health worker been concerned about your drinking or suggested you cut down?: No Alcohol Use Disorder Identification Test Final Score (AUDIT): 4 Brief Intervention: AUDIT score less than 7 or less-screening does not suggest unhealthy drinking-brief intervention not indicated  Allergies:  No Known Allergies Lab  Results:  Results for orders placed or performed during the hospital encounter of 06/27/15 (from the past 48 hour(s))  Urine rapid drug screen (hosp performed)     Status: Abnormal   Collection Time: 06/27/15  1:35 PM  Result Value Ref Range   Opiates NONE DETECTED NONE DETECTED   Cocaine NONE DETECTED NONE DETECTED   Benzodiazepines POSITIVE (A) NONE DETECTED   Amphetamines NONE DETECTED NONE DETECTED   Tetrahydrocannabinol POSITIVE (A) NONE DETECTED   Barbiturates NONE DETECTED NONE DETECTED    Comment:        DRUG SCREEN FOR MEDICAL PURPOSES ONLY.  IF CONFIRMATION IS NEEDED FOR ANY PURPOSE, NOTIFY LAB WITHIN 5 DAYS.        LOWEST DETECTABLE LIMITS FOR URINE DRUG SCREEN Drug Class       Cutoff (ng/mL) Amphetamine      1000 Barbiturate      200 Benzodiazepine   518 Tricyclics       841 Opiates          300 Cocaine          300 THC              50   CBC     Status: Abnormal   Collection Time: 06/27/15  1:56 PM  Result Value Ref Range   WBC 5.8 4.0 - 10.5 K/uL   RBC 4.10 3.87 - 5.11 MIL/uL   Hemoglobin 9.0 (L) 12.0 - 15.0 g/dL   HCT 27.0 (L) 36.0 - 46.0 %   MCV 65.9 (L) 78.0 - 100.0 fL   MCH 22.0 (L) 26.0 - 34.0 pg   MCHC 33.3 30.0 - 36.0 g/dL   RDW 20.1 (H) 11.5 - 15.5 %   Platelets 301 150 - 400 K/uL  Comprehensive metabolic panel     Status: Abnormal   Collection Time: 06/27/15  1:56 PM  Result Value Ref Range   Sodium 138 135 - 145 mmol/L   Potassium 3.8 3.5 - 5.1 mmol/L   Chloride 110 101 - 111 mmol/L   CO2 24 22 - 32 mmol/L   Glucose, Bld 90 65 - 99 mg/dL   BUN 8 6 - 20 mg/dL   Creatinine, Ser 0.62 0.44 - 1.00 mg/dL   Calcium 8.8 (L) 8.9 - 10.3  mg/dL   Total Protein 7.9 6.5 - 8.1 g/dL   Albumin 4.0 3.5 - 5.0 g/dL   AST 18 15 - 41 U/L   ALT 19 14 - 54 U/L   Alkaline Phosphatase 54 38 - 126 U/L   Total Bilirubin 0.3 0.3 - 1.2 mg/dL   GFR calc non Af Amer >60 >60 mL/min   GFR calc Af Amer >60 >60 mL/min    Comment: (NOTE) The eGFR has been calculated  using the CKD EPI equation. This calculation has not been validated in all clinical situations. eGFR's persistently <60 mL/min signify possible Chronic Kidney Disease.    Anion gap 4 (L) 5 - 15  Ethanol     Status: None   Collection Time: 06/27/15  1:56 PM  Result Value Ref Range   Alcohol, Ethyl (B) <5 <5 mg/dL    Comment:        LOWEST DETECTABLE LIMIT FOR SERUM ALCOHOL IS 5 mg/dL FOR MEDICAL PURPOSES ONLY   Acetaminophen level     Status: Abnormal   Collection Time: 06/27/15  1:56 PM  Result Value Ref Range   Acetaminophen (Tylenol), Serum <10 (L) 10 - 30 ug/mL    Comment:        THERAPEUTIC CONCENTRATIONS VARY SIGNIFICANTLY. A RANGE OF 10-30 ug/mL MAY BE AN EFFECTIVE CONCENTRATION FOR MANY PATIENTS. HOWEVER, SOME ARE BEST TREATED AT CONCENTRATIONS OUTSIDE THIS RANGE. ACETAMINOPHEN CONCENTRATIONS >150 ug/mL AT 4 HOURS AFTER INGESTION AND >50 ug/mL AT 12 HOURS AFTER INGESTION ARE OFTEN ASSOCIATED WITH TOXIC REACTIONS.   Salicylate level     Status: None   Collection Time: 06/27/15  1:56 PM  Result Value Ref Range   Salicylate Lvl <7.1 2.8 - 30.0 mg/dL   Current Medications: Current Facility-Administered Medications  Medication Dose Route Frequency Dorota Heinrichs Last Rate Last Dose  . acetaminophen (TYLENOL) tablet 650 mg  650 mg Oral Q6H PRN Harriet Butte, NP      . alum & mag hydroxide-simeth (MAALOX/MYLANTA) 200-200-20 MG/5ML suspension 30 mL  30 mL Oral Q4H PRN Harriet Butte, NP      . Influenza vac split quadrivalent PF (FLUARIX) injection 0.5 mL  0.5 mL Intramuscular Tomorrow-1000 Fernando A Cobos, MD      . magnesium hydroxide (MILK OF MAGNESIA) suspension 30 mL  30 mL Oral Daily PRN Harriet Butte, NP      . pneumococcal 23 valent vaccine (PNU-IMMUNE) injection 0.5 mL  0.5 mL Intramuscular Tomorrow-1000 Myer Peer Cobos, MD      . traZODone (DESYREL) tablet 50 mg  50 mg Oral QHS PRN Harriet Butte, NP   50 mg at 06/27/15 2341   PTA  Medications: Prescriptions prior to admission  Medication Sig Dispense Refill Last Dose  . acetaminophen (TYLENOL) 500 MG tablet Take 1,000 mg by mouth every 6 (six) hours as needed for mild pain.   awhile  . Aspirin-Acetaminophen (GOODYS BODY PAIN PO) Take 1 packet by mouth 2 (two) times daily as needed (for pain and cramping).   Past Week at Unknown time  . cephALEXin (KEFLEX) 500 MG capsule Take 1 capsule (500 mg total) by mouth 4 (four) times daily. (Patient not taking: Reported on 06/26/2015) 20 capsule 0   . HYDROcodone-acetaminophen (NORCO/VICODIN) 5-325 MG per tablet Take 1-2 tablets by mouth every 4 (four) hours as needed. (Patient not taking: Reported on 06/26/2015) 16 tablet 0   . Iron-FA-B Cmp-C-Biot-Probiotic (FUSION PLUS) CAPS Take 1 capsule by mouth daily before breakfast. (Patient not taking:  Reported on 01/13/2015) 30 capsule 5 Taking  . naproxen (NAPROSYN) 500 MG tablet Take 1 tablet (500 mg total) by mouth 2 (two) times daily. (Patient not taking: Reported on 06/26/2015) 30 tablet 0   . PRESCRIPTION MEDICATION Take 1 tablet by mouth daily. Oral contraceptive   06/27/2015 at Unknown time    Previous Psychotropic Medications:  Denies any prior psychiatric medications  Substance Abuse History in the last 12 months:   Denies drug abuse - states that xanax use recently an isolated episode- denies pattern of abuse. States she was not using cannabis and states that she is not sure why her UDS is positive for this substance .    Consequences of Substance Abuse: Denies   Results for orders placed or performed during the hospital encounter of 06/27/15 (from the past 72 hour(s))  Urine rapid drug screen (hosp performed)     Status: Abnormal   Collection Time: 06/27/15  1:35 PM  Result Value Ref Range   Opiates NONE DETECTED NONE DETECTED   Cocaine NONE DETECTED NONE DETECTED   Benzodiazepines POSITIVE (A) NONE DETECTED   Amphetamines NONE DETECTED NONE DETECTED   Tetrahydrocannabinol  POSITIVE (A) NONE DETECTED   Barbiturates NONE DETECTED NONE DETECTED    Comment:        DRUG SCREEN FOR MEDICAL PURPOSES ONLY.  IF CONFIRMATION IS NEEDED FOR ANY PURPOSE, NOTIFY LAB WITHIN 5 DAYS.        LOWEST DETECTABLE LIMITS FOR URINE DRUG SCREEN Drug Class       Cutoff (ng/mL) Amphetamine      1000 Barbiturate      200 Benzodiazepine   185 Tricyclics       631 Opiates          300 Cocaine          300 THC              50   CBC     Status: Abnormal   Collection Time: 06/27/15  1:56 PM  Result Value Ref Range   WBC 5.8 4.0 - 10.5 K/uL   RBC 4.10 3.87 - 5.11 MIL/uL   Hemoglobin 9.0 (L) 12.0 - 15.0 g/dL   HCT 27.0 (L) 36.0 - 46.0 %   MCV 65.9 (L) 78.0 - 100.0 fL   MCH 22.0 (L) 26.0 - 34.0 pg   MCHC 33.3 30.0 - 36.0 g/dL   RDW 20.1 (H) 11.5 - 15.5 %   Platelets 301 150 - 400 K/uL  Comprehensive metabolic panel     Status: Abnormal   Collection Time: 06/27/15  1:56 PM  Result Value Ref Range   Sodium 138 135 - 145 mmol/L   Potassium 3.8 3.5 - 5.1 mmol/L   Chloride 110 101 - 111 mmol/L   CO2 24 22 - 32 mmol/L   Glucose, Bld 90 65 - 99 mg/dL   BUN 8 6 - 20 mg/dL   Creatinine, Ser 0.62 0.44 - 1.00 mg/dL   Calcium 8.8 (L) 8.9 - 10.3 mg/dL   Total Protein 7.9 6.5 - 8.1 g/dL   Albumin 4.0 3.5 - 5.0 g/dL   AST 18 15 - 41 U/L   ALT 19 14 - 54 U/L   Alkaline Phosphatase 54 38 - 126 U/L   Total Bilirubin 0.3 0.3 - 1.2 mg/dL   GFR calc non Af Amer >60 >60 mL/min   GFR calc Af Amer >60 >60 mL/min    Comment: (NOTE) The eGFR has been calculated using the CKD  EPI equation. This calculation has not been validated in all clinical situations. eGFR's persistently <60 mL/min signify possible Chronic Kidney Disease.    Anion gap 4 (L) 5 - 15  Ethanol     Status: None   Collection Time: 06/27/15  1:56 PM  Result Value Ref Range   Alcohol, Ethyl (B) <5 <5 mg/dL    Comment:        LOWEST DETECTABLE LIMIT FOR SERUM ALCOHOL IS 5 mg/dL FOR MEDICAL PURPOSES ONLY   Acetaminophen  level     Status: Abnormal   Collection Time: 06/27/15  1:56 PM  Result Value Ref Range   Acetaminophen (Tylenol), Serum <10 (L) 10 - 30 ug/mL    Comment:        THERAPEUTIC CONCENTRATIONS VARY SIGNIFICANTLY. A RANGE OF 10-30 ug/mL MAY BE AN EFFECTIVE CONCENTRATION FOR MANY PATIENTS. HOWEVER, SOME ARE BEST TREATED AT CONCENTRATIONS OUTSIDE THIS RANGE. ACETAMINOPHEN CONCENTRATIONS >150 ug/mL AT 4 HOURS AFTER INGESTION AND >50 ug/mL AT 12 HOURS AFTER INGESTION ARE OFTEN ASSOCIATED WITH TOXIC REACTIONS.   Salicylate level     Status: None   Collection Time: 06/27/15  1:56 PM  Result Value Ref Range   Salicylate Lvl <4.0 2.8 - 30.0 mg/dL    Observation Level/Precautions:  15 minute checks  Laboratory:   Describes chronic anemia- agrees to anemia work up, and will obtain TSH .   Psychotherapy:  milieu, support  Medications:  At this time not interested in any standing psychiatric medication  Consultations:  If needed   Discharge Concerns:  -   Estimated LOS: 3-4 days   Other:     Psychological Evaluations: no   Treatment Plan Summary: Daily contact with patient to assess and evaluate symptoms and progress in treatment, Medication management, Plan inpatient admission and treatment as above   Medical Decision Making:  Established Problem, Stable/Improving (1), Review of Psycho-Social Stressors (1), Review or order clinical lab tests (1) and Review of Medication Regimen & Side Effects (2)  I certify that inpatient services furnished can reasonably be expected to improve the patient's condition.   COBOS, Felicita Gage 9/26/20169:35 AM

## 2015-06-28 NOTE — Progress Notes (Signed)
Recreation Therapy Notes  Date: 09.26.2016 Time: 9:30am Location: [address] Group Room   Group Topic: Stress Management  Goal Area(s) Addresses:  Patient will actively participate in stress management techniques presented during session.   Behavioral Response: Did not attend.   Eulalah Rupert L Marcelus Dubberly, LRT/CTRS        Janiyah Beery L 06/28/2015 1:16 PM 

## 2015-06-28 NOTE — [facility] Suicide Risk Assessment (Signed)
[facility] Admission Suicide Risk Assessment   Nursing information obtained from:   chart, patient  Demographic factors:   24 year old single, employed, has three children  Current Mental Status:   see below  Loss Factors:   limited support network , busy schedule  Historical Factors:   denies prior psychiatric history  Risk Reduction Factors:   sense of responsibility to family  Total Time spent with patient: 45 minutes Principal Problem:  BZD Overdose , Undetermined Intent  Diagnosis:   Patient Active Problem List   Diagnosis Date Noted  . Major depressive disorder, single episode [F32.9] 06/27/2015     Continued Clinical Symptoms:  Alcohol Use Disorder Identification Test Final Score (AUDIT): 4 The "Alcohol Use Disorders Identification Test", Guidelines for Use in Primary Care, Second Edition.  World Science writer [facility]). Score between 0-7:  no or low risk or alcohol related problems. Score between 8-15:  moderate risk of alcohol related problems. Score between 16-19:  high risk of alcohol related problems. Score 20 or above:  warrants further diagnostic evaluation for alcohol dependence and treatment.   CLINICAL FACTORS:   24 year old single female, reports recent use of BZD + Alcohol , resulting in time limited blurry vision/ vision loss, which she states caused her to come to ED. She initially reported taking several Xanax, presented depressed, and reported passive SI. At this time minimizes any depression, states only took one xanax tablet. Currently much improved, denies depression, and is future oriented .     Psychiatric Specialty Exam: Physical Exam  ROS  Blood pressure 108/77, pulse 124, temperature 98.3 F (36.8 C), temperature source Oral, resp. rate 16, height 5' 5.5" (1.664 m), weight 143 lb (64.864 kg), last menstrual period 06/27/2015.Body mass index is 23.43 kg/(m^2).  See Admit Note  MSE                                                         COGNITIVE FEATURES THAT CONTRIBUTE TO RISK:  Closed-mindedness and Loss of executive function    SUICIDE RISK:   Mild:  Suicidal ideation of limited frequency, intensity, duration, and specificity.  There are no identifiable plans, no associated intent, mild dysphoria and related symptoms, good self-control (both objective and subjective assessment), few other risk factors, and identifiable protective factors, including available and accessible social support.  PLAN OF CARE: Patient will be admitted to inpatient psychiatric unit for stabilization and safety. Will provide and encourage milieu participation. Provide medication management and maked adjustments as needed.  Will follow daily.    Medical Decision Making:  Established Problem, Stable/Improving (1), Review of Psycho-Social Stressors (1), Review or order clinical lab tests (1) and Review of Medication Regimen & Side Effects (2)  I certify that inpatient services furnished can reasonably be expected to improve the patient's condition.   COBOS, FERNANDO 06/28/2015, 11:14 AM

## 2015-06-28 NOTE — [facility] Group Notes (Signed)
[facility] LCSW Group Therapy  06/28/2015 1:15pm  Type of Therapy:  Group Therapy vercoming Obstacles  Participation Level:  Active  Participation Quality:  Appropriate   Affect:  Appropriate  Cognitive:  Appropriate and Oriented  Insight:  Developing/Improving and Improving  Engagement in Therapy:  Improving  Modes of Intervention:  Discussion, Exploration, Problem-solving and Support  Description of Group:   In this group patients will be encouraged to explore what they see as obstacles to their own wellness and recovery. They will be guided to discuss their thoughts, feelings, and behaviors related to these obstacles. The group will process together ways to cope with barriers, with attention given to specific choices patients can make. Each patient will be challenged to identify changes they are motivated to make in order to overcome their obstacles. This group will be process-oriented, with patients participating in exploration of their own experiences as well as giving and receiving support and challenge from other group members.  Summary of Patient Progress: Pt identified grief related to her brother's murder two years ago as an obstacle. Pt describes this as a turning point in her emotional stability, reporting that her mood is more labile now and that she cries more often. Pt identifies her mother as a strong support and described the necessity of having supportive people in her life as she works to overcome obstacles.   Therapeutic Modalities:   Cognitive Behavioral Therapy Solution Focused Therapy Motivational Interviewing Relapse Prevention Therapy   Chad Cordial, LCSWA 06/28/2015 4:58 PM

## 2015-06-28 NOTE — Progress Notes (Signed)
D: Pt is a 24 year old female admitted voluntarily to [facility] after she took Xanax and drank alcohol.  Pt reports "I don't know why they made me come here.  They probably think I took Xanax a bunch together at the same time.  I took one Xanax and went to the club.  When I woke up, I felt like I was dying, I couldn't feel my legs."  Pt states "I didn't feel like myself, I wasn't trying to kill myself, I would never do that."  Pt reports she has been "fighting with my boyfriend after I found out he was unfaithful."  She reports this has been her primary stressor.  Pt reports she is worried about who will take care of her children while she is at [facility].  Pt is also worried about possibly losing her job.  She denies SI/HI during assessment, denies pain, denies hallucinations.  She was very tearful when speaking with Clinical research associate.  She reports a lack of support, identifying her best friend as her only support besides her boyfriend, whom she is no longer with.  She states "all I had was my boyfriend, but I feel like I don't have nobody."  She denies medical problems.  She reports her goal while at [facility] is to "stop being a crybaby and know how to handle my situation" and to learn new coping skills.    A: Admission process and paperwork completed with pt.  Non-invasive body assessment completed, pt has scar on back of right foot and tattoos on right thigh, right wrist, bottom of right leg, right and left side of chest, 2 on her back, her neck, 3 on her left arm.  Belongings searched for contraband and items not allowed on the unit are in locker 1.  On-call provider contacted for admission orders.  Pt oriented to unit.  PRN medication administered for sleep.  Encouraged, supported, and actively listened to pt.  72 hour AMA discharge request signed by pt and put on front of chart.    R: Pt is compliant with medication.  She is cooperative with admission process.  Pt verbally contracts for safety and reports that she will notify staff  of needs and concerns.  Will continue to monitor and assess.

## 2015-06-28 NOTE — BHH Group Notes (Signed)
Adult Psychoeducational Group Note  Date:  06/28/2015 Time:  9:21 PM  Group Topic/Focus:  Wrap-Up Group:   The focus of this group is to help patients review their daily goal of treatment and discuss progress on daily workbooks.  Participation Level:  Active  Participation Quality:  Appropriate  Affect:  Appropriate  Cognitive:  Appropriate  Insight: Good  Engagement in Group:  Engaged  Modes of Intervention:  Discussion  Additional Comments:  Patient stated her day was good and better day than before.  She expressed that she was more social today and met some new people.  Victorino Sparrow A 06/28/2015, 9:21 PM

## 2015-06-28 NOTE — Progress Notes (Signed)
D:  Patient's self inventory sheet, patient slept good, sleep medication is helpful.  Fair appetite, normal energy level, good concentration.  Denied depression, hopeless and anxiety.  Denied withdrawals.  Denied SI.  Denied physical problems.  Denied pain.  Goal is to go home to children.  "Plan to do what it is I need to do.  I don't think I should be here.  I would never hurt myself or anyone else."  No discharge plans.  Wants to talk to MD about discharge and children. A:  Medications administered per MD orders.  Emotional support and encouragement given patient. R:  Denied SI and HI, contracts for safety.  Denied A/V hallucinations.  Safety maintained with 15 minute checks.

## 2015-06-28 NOTE — Progress Notes (Addendum)
Patient ID: Meagan [name], female   DOB: 21-Jun-1991, 24 y.o.   MRN: [id number] D: Client visible on the unit, has a visitor today, interacts appropriately with staff. Client reports "I really shouldn't be here" notes that she did not try to kill herself, says she was here d/t "partying to hard"  "I was out with some friends, you can ask my momma, I was going through a lot and felt like I needed some space" "should have been home getting ready for church that Sunday morning" "I don't even go out, but I did, trying to keep up with some friends, they took some pills so I did to and got paranoid after I took them" "I won't never do that no more" Client reports stressed when her children's dad left her a couple of weeks ago. "I go to school, work, and take care of my kids" "I got so much school work to catch up on from being in here" client reports admission has been helpful though "getting away from it all" "met a good group of people" "opened up" Client complains of menstrual cramps "5" of 10. A: Writer provided emotional support encouraged client to consider therapeutic services, upon discharge. Reviewed medication, administered as ordered.Staff with Lonell Grandchild, PA received order for Naproxen 500 mg (see MAR).Staff will monitor q35min for safety. R: Client is safe on the unit, attend group.Client reports her mom and church is her support systems. She plans to seek help for children also.

## 2015-06-28 NOTE — BHH Group Notes (Signed)
[facility] LCSW Aftercare Discharge Planning Group Note  06/28/2015 8:45 AM  Participation Quality: Alert, Appropriate and Oriented  Mood/Affect: Appropriate  Depression Rating: 0  Anxiety Rating: 0  Thoughts of Suicide: Pt denies SI/HI  Will you contract for safety? Yes  Current AVH: Pt denies  Plan for Discharge/Comments: Pt attended discharge planning group and actively participated in group. CSW discussed suicide prevention education with the group and encouraged them to discuss discharge planning and any relevant barriers. Pt reports that she feels that she shouldn't be here because she is not suicidal and does not feel depressed. She declines follow-up. Requests work note as she does not want to lose her job.  Transportation Means: Pt reports access to transportation  Supports: No supports mentioned at this time  Chad Cordial, Theresia Majors 06/28/2015 9:36 AM

## 2015-06-29 DIAGNOSIS — T424X1A Poisoning by benzodiazepines, accidental (unintentional), initial encounter: Secondary | ICD-10-CM | POA: Insufficient documentation

## 2015-06-29 DIAGNOSIS — F329 Major depressive disorder, single episode, unspecified: Secondary | ICD-10-CM | POA: Diagnosis present

## 2015-06-29 LAB — TSH: TSH: 2.886 u[IU]/mL (ref 0.350–4.500)

## 2015-06-29 LAB — IRON AND TIBC
Iron: 53 ug/dL (ref 28–170)
SATURATION RATIOS: 10 % — AB (ref 10.4–31.8)
TIBC: 528 ug/dL — ABNORMAL HIGH (ref 250–450)
UIBC: 475 ug/dL

## 2015-06-29 LAB — RETICULOCYTES
RBC.: 4.51 MIL/uL (ref 3.87–5.11)
RETIC COUNT ABSOLUTE: 49.6 10*3/uL (ref 19.0–186.0)
Retic Ct Pct: 1.1 % (ref 0.4–3.1)

## 2015-06-29 LAB — FERRITIN: FERRITIN: 4 ng/mL — AB (ref 11–307)

## 2015-06-29 LAB — VITAMIN B12: Vitamin B-12: 570 pg/mL (ref 180–914)

## 2015-06-29 LAB — FOLATE: Folate: 22.3 ng/mL (ref >5.9)

## 2015-06-29 MED ORDER — NAPROXEN 500 MG PO TABS
500.0000 mg | ORAL_TABLET | Freq: Two times a day (BID) | ORAL | Status: DC
  Filled 2015-06-29: qty 14

## 2015-06-29 MED ORDER — ACETAMINOPHEN 325 MG PO TABS: 650.0000 mg | ORAL_TABLET | Freq: Four times a day (QID) | ORAL | Status: DC | PRN

## 2015-06-29 MED ORDER — NAPROXEN 500 MG PO TABS: 500.0000 mg | ORAL_TABLET | Freq: Two times a day (BID) | ORAL | Status: DC

## 2015-06-29 MED ORDER — ALUM & MAG HYDROXIDE-SIMETH 200-200-20 MG/5ML PO SUSP: 30.0000 mL | ORAL | Status: DC | PRN

## 2015-06-29 MED ORDER — MAGNESIUM HYDROXIDE 400 MG/5ML PO SUSP: 30.0000 mL | Freq: Every day | ORAL | Status: DC | PRN

## 2015-06-29 MED ORDER — ACETAMINOPHEN 500 MG PO TABS: 1000.0000 mg | ORAL_TABLET | Freq: Four times a day (QID) | ORAL | Status: DC | PRN

## 2015-06-29 MED ORDER — HYDROXYZINE HCL 25 MG PO TABS: 25.0000 mg | ORAL_TABLET | Freq: Four times a day (QID) | ORAL | Status: DC | PRN

## 2015-06-29 MED ORDER — TRAZODONE HCL 50 MG PO TABS: 50.0000 mg | ORAL_TABLET | Freq: Every evening | ORAL | Status: DC | PRN

## 2015-06-29 NOTE — BHH Suicide Risk Assessment (Signed)
[facility] Discharge Suicide Risk Assessment   Demographic Factors:  24 year old female, has three children, is in college and is employed   Total Time spent with patient: 30 minutes  Musculoskeletal: Strength & Muscle Tone: within normal limits Gait & Station: normal Patient leans: N/A  Psychiatric Specialty Exam: Physical Exam  ROS  Blood pressure 118/85, pulse 100, temperature 98 F (36.7 C), temperature source Oral, resp. rate 16, height 5' 5.5" (1.664 m), weight 143 lb (64.864 kg), last menstrual period 06/27/2015.Body mass index is 23.43 kg/(m^2).  General Appearance: Well Groomed  Patent attorney::  Good  Speech:  Normal Rate409  Volume:  Normal  Mood:  Euthymic  Affect:  Appropriate  Thought Process:  Linear  Orientation:  Full (Time, Place, and Person)  Thought Content:  no hallucinations, no delusions  Suicidal Thoughts:  No  Homicidal Thoughts:  No  Memory:  recent and remote grossly intact   Judgement:  Other:  improved   Insight:  improved   Psychomotor Activity:  Normal  Concentration:  Good  Recall:  Good  Fund of Knowledge:Good  Language: Good  Akathisia:  Negative  Handed:  Right  AIMS (if indicated):     Assets:  Communication Skills Desire for Improvement Resilience  Sleep:  Number of Hours: 6  Cognition: WNL  ADL's:  Intact   Have you used any form of tobacco in the last 30 days? (Cigarettes, Smokeless Tobacco, Cigars, and/or Pipes): No  Has this patient used any form of tobacco in the last 30 days? (Cigarettes, Smokeless Tobacco, Cigars, and/or Pipes) No  Mental Status Per Nursing Assessment::   On Admission:     Current Mental Status by Physician: At this time patient is much improved compared to prior to admission- she currently is euthymic , affect is brighter and reactive, no thought disorder, no hallucinations, no delusions, no suicidal ideations, no HI, future oriented, looking forward to seeing her children, and to return to work.   Loss  Factors: Recent break up with her GF, dealing with small children, employment, college classes   Historical Factors: Denies any prior history of psychiatric admissions or suicide attempts   Risk Reduction Factors:   Responsible for children under 56 years of age, Sense of responsibility to family, Positive social support and Positive coping skills or problem solving skills  Continued Clinical Symptoms:  As noted, currently much improved compared to admission  Cognitive Features That Contribute To Risk:  No gross cognitive deficits noted upon discharge. Is alert , attentive, and oriented x 3    Suicide Risk:  Mild:  Suicidal ideation of limited frequency, intensity, duration, and specificity.  There are no identifiable plans, no associated intent, mild dysphoria and related symptoms, good self-control (both objective and subjective assessment), few other risk factors, and identifiable protective factors, including available and accessible social support.  Principal Problem: Benzodiazepine overdose of undetermined intent Discharge Diagnoses:  Patient Active Problem List   Diagnosis Date Noted  . Benzodiazepine overdose of undetermined intent [T42.4X4A] 06/28/2015  . Major depressive disorder, single episode [F32.9] 06/27/2015    Follow-up Information    [facility] On 07/01/[facility].   Why:  at 1:00pm for therapy with Lorelee Market   Contact information:   [facility] [address] [place], [place] [address]       Plan Of Care/Follow-up recommendations:  Activity:  as tolerated  Diet:  Regular  Tests:  NA Other:  see  below   Is patient on multiple antipsychotic therapies at discharge:  No   Has Patient had three or more failed trials of antipsychotic monotherapy by history:  No  Recommended Plan for Multiple Antipsychotic Therapies: NA  Patient is leaving in good spirits. Plans to return home.  Mother is picking  her up later today. We discussed her Anemia and findings suggestive of Iron Deficiency Anemia- she has been diagnosed with this before and has iron supplement at home. She follows up at [facility] for medical follow ups .  COBOS, FERNANDO 06/29/2015, 10:49 AM

## 2015-06-29 NOTE — Progress Notes (Signed)
  [facility] Adult Case Management Discharge Plan :  Will you be returning to the same living situation after discharge:  Yes,  Pt returning to her home At discharge, do you have transportation home?: Yes,  Pt fiance to provide ride Do you have the ability to pay for your medications: Pt not prescribed home medications  Release of information consent forms completed and in the chart;  Patient's signature needed at discharge.  Patient to Follow up at: Follow-up Information    Follow up with [facility] On 07/01/2015.   Why:  at 1:00pm for therapy with Lorelee Market   Contact information:   [facility] [address] [place], [place] [address]       Patient denies SI/HI: Yes,  Pt denies    Safety Planning and Suicide Prevention discussed: Yes,  with fiance; see SPE note for further details  Have you used any form of tobacco in the last 30 days? (Cigarettes, Smokeless Tobacco, Cigars, and/or Pipes): No  Has patient been referred to the Quitline?: N/A patient is not a smoker  Elaina Hoops 06/29/2015, 11:36 AM

## 2015-06-29 NOTE — Progress Notes (Signed)
Pt scheduled for DC within 48 hours of admission. PSA not completed.  CSW met briefly with Pt to discuss aftercare and sign releases. She was agreeable to referral to [facility]. CSW secured appointment for 9/29.   Peri Maris, [place] [facility] [phone]

## 2015-06-29 NOTE — Progress Notes (Signed)
D:  Patient's self inventory sheet, patient slept good, sleep medication is helpful.  Good appetite, normal energy level, good concentration.  Denied depression, anxiety and hopeless.  Denied withdrawals.  Denied SI.  Denied physical problems.  Goal is to get back to children and normal life.  Plans to do whatever to leave today.  Does have discharge plans.  No problems anticipated after discharge. A:  Medications administered per MD orders.  Emotional support and encouragement given patient. R:  Denied SI and HI, contracts for safety.  Denied A/V hallucinations.  Safety maintained with 15 minute checks.

## 2015-06-29 NOTE — Discharge Summary (Signed)
Physician Discharge Summary Note  Patient:  Meagan [name] is an 24 y.o., female MRN:  [id number] DOB:  [birth year] Patient phone:  [phone] (home)  Patient address:   [address] [name] [address],  Total Time spent with patient: Greater than 30 minutes  Date of Admission:  06/27/2015  Date of Discharge: 06-29-15  Reason for Admission: Suicide attempt by overdose  Principal Problem: Benzodiazepine overdose of undetermined intent  Discharge Diagnoses: Patient Active Problem List   Diagnosis Date Noted  . MDD (major depressive disorder) [F32.2] 06/29/2015  . Overdose of benzodiazepine [T42.4X1A]   . Benzodiazepine overdose of undetermined intent [T42.4X4A] 06/28/2015  . Major depressive disorder, single episode [F32.9] 06/27/2015   Musculoskeletal: Strength & Muscle Tone: within normal limits Gait & Station: normal Patient leans: N/A  Psychiatric Specialty Exam: Physical Exam  Psychiatric: Her speech is normal and behavior is normal. Judgment and thought content normal. Her mood appears not anxious. Her affect is not angry, not blunt, not labile and not inappropriate. Cognition and memory are normal. She does not exhibit a depressed mood.    Review of Systems  Constitutional: Negative.   HENT: Negative.   Eyes: Negative.   Respiratory: Negative.   Cardiovascular: Negative.   Gastrointestinal: Negative.   Genitourinary: Negative.   Musculoskeletal: Negative.   Skin: Negative.   Neurological: Negative.   Endo/Heme/Allergies: Negative.   Psychiatric/Behavioral: Positive for depression (Stable) and substance abuse (Benzodiazepine abuse (OD)). Negative for suicidal ideas, hallucinations and memory loss. The patient has insomnia (Stable). The patient is not nervous/anxious.     Blood pressure 118/85, pulse 100, temperature 98 F (36.7 C), temperature source Oral, resp. rate 16, height 5' 5.5" (1.664 m), weight 64.864 kg (143 lb), last menstrual period  06/27/2015.Body mass index is 23.43 kg/(m^2).  See Md's SRA   Have you used any form of tobacco in the last 30 days? (Cigarettes, Smokeless Tobacco, Cigars, and/or Pipes): No  Has this patient used any form of tobacco in the last 30 days? (Cigarettes, Smokeless Tobacco, Cigars, and/or Pipes) No  Past Medical History:  Past Medical History  Diagnosis Date  . Anemia   . Chlamydia   . Headache(784.0)   . Hypertension     no meds  . Pregnancy induced hypertension     Past Surgical History  Procedure Laterality Date  . Hand surgery     Family History:  Family History  Problem Relation Age of Onset  . Hypertension    . Diabetes     Social History:  History  Alcohol Use No     History  Drug Use No    Social History   Social History  . Marital Status: Single    Spouse Name: N/A  . Number of Children: 2  . Years of Education: N/A   Occupational History  .     Social History Main Topics  . Smoking status: Former Smoker    Types: Cigarettes  . Smokeless tobacco: Never Used  . Alcohol Use: No  . Drug Use: No  . Sexual Activity:    Partners: Male    Birth Control/ Protection: Pill   Other Topics Concern  . None   Social History Narrative   Risk to Self: Is patient at risk for suicide?: No Risk to Others: No Prior Inpatient Therapy: Yes Prior Outpatient Therapy: Yes  Level of Care:  OP  Hospital Course: 24 year old female. States that last week had a TBI- concussion at work. States that this  might have influenced her decision ( by causing her to feel impulsive) to go out with " some people I really should not be hanging out with". States " they talked me into taking some Xanax, which I took, and I started feeling sick". States that after waking upthe next morning, she felt she could not see out of her R eye , which led " me to flip out". States she called police and they " assumed that I had suicidal ideations" , and brought her to hospital. Patient reports, "  I was not suicidal, I just made a stupid mistakeby taking that Xanax ".    Meagan [name]'s stay in this hospital was rather very brief. She came in to the hospital with a worsening symptoms of depression & suspected suicidal ideations. She also admitted impulsively taking some Xanax tablets because she was with the wrong group at the wrong time that she was unable to make the right decisions. This led to her hospitalization for psychiatric evaluation & possible treatment. After admission assessment, her symptoms were evaluated & noted. Medication regimen targeting those symptoms were initiated. She was also enrolled in the group counseling sessions to learn coping skills that should help her after discharge to cope better & effectively to maintain mood stability. Meagan [name] was medicated & discharged on; Hydroxyzine 25 mg prn for anxiety & Trazodone 50 mg for insomnia. She presented no other significant pre-existing medical issues that required treatment & or monitoring. She tolerated her treatment regimen without any significant adverse effects & or reactions reported.  Meagan [name]'s symptoms responded well to her treatment regimen. It is now apparent that the combination of medication & group counseling sessions proved effective in her mood stability, even for a short period of time. Meagan [name] has decided to be discharged to go back home to follow-up care on an outpatient basis as noted below. She States she is ready to move on with her life. She is committed to abstaining from substances. Upon discharge, she appears much more in control of her mood & behavior than upon admission. Her symptoms were reported as significantly improved or completely resolved There are currently, no active SI plans or intent, AVH, delusional thoughts or paranoia. She is going to pursue outpatient treatment as noted ealier. She was provided with a 7 days worth, supply samples of her [facility] discharge medications. She left [facility] with all personal  belongings in no apparent distress. Transportation per her arrangement.  Consults:  psychiatry  Significant Diagnostic Studies:  labs: CBC with diff, CMP, UDS, toxicology tests, U/A, results reviewed, stable  Discharge Vitals:   Blood pressure 118/85, pulse 100, temperature 98 F (36.7 C), temperature source Oral, resp. rate 16, height 5' 5.5" (1.664 m), weight 64.864 kg (143 lb), last menstrual period 06/27/2015. Body mass index is 23.43 kg/(m^2). Lab Results:   Results for orders placed or performed during the hospital encounter of 06/27/15 (from the past 72 hour(s))  Ferritin     Status: Abnormal   Collection Time: 06/29/15  7:05 AM  Result Value Ref Range   Ferritin 4 (L) 11 - 307 ng/mL    Comment: Performed at [facility]  Folate     Status: None   Collection Time: 06/29/15  7:05 AM  Result Value Ref Range   Folate 22.3 >5.9 ng/mL    Comment: Performed at [facility]  Iron and TIBC     Status: Abnormal   Collection Time: 06/29/15  7:05 AM  Result Value Ref Range  Iron 53 28 - 170 ug/dL   TIBC 409 (H) 811 - 914 ug/dL   Saturation Ratios 10 (L) 10.4 - 31.8 %   UIBC 475 ug/dL    Comment: Performed at [facility]  Reticulocytes     Status: None   Collection Time: 06/29/15  7:05 AM  Result Value Ref Range   Retic Ct Pct 1.1 0.4 - 3.1 %   RBC. 4.51 3.87 - 5.11 MIL/uL   Retic Count, Manual 49.6 19.0 - 186.0 K/uL    Comment: Performed at [facility]  TSH     Status: None   Collection Time: 06/29/15  7:05 AM  Result Value Ref Range   TSH 2.886 0.350 - 4.500 uIU/mL    Comment: Performed at [facility]  Vitamin B12     Status: None   Collection Time: 06/29/15  7:05 AM  Result Value Ref Range   Vitamin B-12 570 180 - 914 pg/mL    Comment: (NOTE) This assay is not validated for testing neonatal or myeloproliferative syndrome specimens for Vitamin B12 levels. Performed at [facility]    Physical  Findings:  AIMS: Facial and Oral Movements Muscles of Facial Expression: None, normal Lips and Perioral Area: None, normal Jaw: None, normal Tongue: None, normal,Extremity Movements Upper (arms, wrists, hands, fingers): None, normal Lower (legs, knees, ankles, toes): None, normal, Trunk Movements Neck, shoulders, hips: None, normal, Overall Severity Severity of abnormal movements (highest score from questions above): None, normal Incapacitation due to abnormal movements: None, normal Patient's awareness of abnormal movements (rate only patient's report): No Awareness, Dental Status Current problems with teeth and/or dentures?: No Does patient usually wear dentures?: No  CIWA:  CIWA-Ar Total: 0 COWS:  COWS Total Score: 1  See Psychiatric Specialty Exam and Suicide Risk Assessment completed by Attending Physician prior to discharge.  Discharge destination:  Home  Is patient on multiple antipsychotic therapies at discharge:  No   Has Patient had three or more failed trials of antipsychotic monotherapy by history:  No  Recommended Plan for Multiple Antipsychotic Therapies: NA    Medication List    STOP taking these medications        cephALEXin 500 MG capsule  Commonly known as:  KEFLEX     FUSION PLUS Caps     GOODYS BODY PAIN PO     HYDROcodone-acetaminophen 5-325 MG tablet  Commonly known as:  NORCO/VICODIN     PRESCRIPTION MEDICATION      TAKE these medications      Indication   acetaminophen 500 MG tablet  Commonly known as:  TYLENOL  Take 2 tablets (1,000 mg total) by mouth every 6 (six) hours as needed for mild pain.   Indication:  Pain     hydrOXYzine 25 MG tablet  Commonly known as:  ATARAX/VISTARIL  Take 1 tablet (25 mg total) by mouth every 6 (six) hours as needed for anxiety.   Indication:  Anxiety     naproxen 500 MG tablet  Commonly known as:  NAPROSYN  Take 1 tablet (500 mg total) by mouth 2 (two) times daily. For pain   Indication:  Pain      traZODone 50 MG tablet  Commonly known as:  DESYREL  Take 1 tablet (50 mg total) by mouth at bedtime as needed for sleep (May repeat x1). For insomnia   Indication:  Trouble Sleeping       Follow-up Information    [facility]   Why:  at 1:00pm for therapy with Lorelee Market   Contact information:   [facility] [address] [name], [place] [address]      Follow-up recommendations: Activity:  As tolerated Diet: As recommended by your primary care doctor. Keep all scheduled follow-up appointments as recommended.    Comments: Take all your medications as prescribed by your mental healthcare provider. Report any adverse effects and or reactions from your medicines to your outpatient provider promptly. Patient is instructed and cautioned to not engage in alcohol and or illegal drug use while on prescription medicines. In the event of worsening symptoms, patient is instructed to call the crisis hotline, 911 and or go to the nearest ED for appropriate evaluation and treatment of symptoms. Follow-up with your primary care provider for your other medical issues, concerns and or health care needs.   Total Discharge Time: Greater than 30 minutes  Signed: Sanjuana Kava, PMHNP, FNP-BC 06/29/2015, 11:01 AM   Patient seen, Suicide Assessment Completed.  Disposition Plan Reviewed

## 2015-06-29 NOTE — Progress Notes (Signed)
Discharge Note:  Patient discharged home with family member.  Denied SI and HI.  Denied A/V hallucinations.  Denied pain.  Suicide prevention information given and discussed with patient who stated she understood and had no questions.  Patient stated she received all her belongings, prescriptions, clothing, toiletries, misc items, wallet, charger and phone, scarf, earrings, cards, etc.  Patient stated she appreciated all assistance received from [facility] staff.

## 2015-06-29 NOTE — [facility] Suicide Risk Assessment (Signed)
[facility] INPATIENT:  Family/Significant Other Suicide Prevention Education  Suicide Prevention Education:  Education Completed; Meagan [name], Pt's fiance [phone] Dawn been identified by the patient as the family member/significant other with whom the patient will be residing, and identified as the person(s) who will aid the patient in the event of a mental health crisis (suicidal ideations/suicide attempt).  With written consent from the patient, the family member/significant other has been provided the following suicide prevention education, prior to the and/or following the discharge of the patient.  The suicide prevention education provided includes the following:  Suicide risk factors  Suicide prevention and interventions  National Suicide Hotline telephone number  [facility] assessment telephone number  [facility] Emergency Assistance 911  [facility] and/or Residential Mobile Crisis Unit telephone number  Request made of family/significant other to:  Remove weapons (e.g., guns, rifles, knives), all items previously/currently identified as safety concern.    Remove drugs/medications (over-the-counter, prescriptions, illicit drugs), all items previously/currently identified as a safety concern.  The family member/significant other verbalizes understanding of the suicide prevention education information provided.  The family member/significant other agrees to remove the items of safety concern listed above.  Meagan [name] 06/29/2015, 11:25 AM

## 2015-06-29 NOTE — Plan of Care (Signed)
Problem: Diagnosis: Increased Risk For Suicide Attempt Goal: STG-Patient will be able to identify reason for suicidal STG-Patient will be able to identify reason for suicidal ideation  Outcome: Progressing Client identifies stressor with children's dad leaving about two weeks ago, school and work, but insists it was not a suicide attempt but merely "just partying to hard" "my friend was taking pills so I took some, then got paranoid" "I'll never do that again"

## 2015-06-29 NOTE — Progress Notes (Signed)
[facility] Group Notes:  (Nursing/MHT/Case Management/Adjunct)  Date:  06/29/2015  Time:  10:00 AM  Type of Therapy:  Psychoeducational Skills  Participation Level:  Active  Participation Quality:  Attentive  Affect:  Anxious  Cognitive:  Appropriate  Insight:  Improving  Engagement in Group:  Distracting  Modes of Intervention:  Confrontation, Limit-setting and Socialization  Summary of Progress/Problems: Patient got off task and began talking about personal issues.   Donell Beers 06/29/2015, 10:00 AM

## 2015-06-29 NOTE — Tx Team (Signed)
Interdisciplinary Treatment Plan Update (Adult) Date: 06/29/2015   Date: 06/29/2015 11:32 AM  Progress in Treatment:  Attending groups: Yes  Participating in groups: Yes  Taking medication as prescribed: Yes  Tolerating medication: Yes  Family/Significant othe contact made: Yes, with fiance Patient understands diagnosis: Yes Discussing patient identified problems/goals with staff: Yes  Medical problems stabilized or resolved: Yes  Denies suicidal/homicidal ideation: Yes Patient has not harmed self or Others: Yes   New problem(s) identified: None identified at this time.   Discharge Plan or Barriers: Pt to return home and follow[facility] with [facility]  Additional comments: n/a   Reason for Continuation of Hospitalization:  Depression Suicidal ideation  Estimated length of stay: 3-5 days  Review of initial/current patient goals per problem list:   1.  Goal(s): Patient will participate in aftercare plan  Met:  Yes  Target date: 3-5 days from date of admission   As evidenced by: Patient will participate within aftercare plan AEB aftercare provider and housing plan at discharge being identified.   06/29/15: Pt will return home and follow-up with opt providers  2.  Goal (s): Patient will exhibit decreased depressive symptoms and suicidal ideations.  Met:  Yes  Target date: 3-5 days from date of admission   As evidenced by: Patient will utilize self rating of depression at 3 or below and demonstrate decreased signs of depression or be deemed stable for discharge by MD.  06/29/15: Pt rates depression at 0/10 and denies SI.  Attendees:  Patient:    Family:    Physician: Dr. Parke Poisson, MD  06/29/2015 11:32 AM  Nursing: Lars Pinks, RN Case manager  06/29/2015 11:32 AM  Clinical Social Worker Norman Clay, MSW 06/29/2015 11:32 AM  Other: Jake Bathe Liasion 06/29/2015 11:32 AM  Clinical:  Grayland Ormond, RN 06/29/2015 11:32 AM  Other: , RN Charge  Nurse 06/29/2015 11:32 AM  Other:     Peri Maris, Latanya Presser MSW

## 2015-09-14 ENCOUNTER — Inpatient Hospital Stay (HOSPITAL_COMMUNITY)
Admission: AD | Admit: 2015-09-14 | Discharge: 2015-09-14 | Disposition: A | Discharge status: Home / Self Care | Payer: Self-pay | Source: Ambulatory Visit | Attending: Family Medicine | Admitting: Family Medicine

## 2015-09-14 DIAGNOSIS — R109 Unspecified abdominal pain: Secondary | ICD-10-CM

## 2015-09-14 DIAGNOSIS — Z3202 Encounter for pregnancy test, result negative: Secondary | ICD-10-CM | POA: Insufficient documentation

## 2015-09-14 LAB — POCT PREGNANCY, URINE: PREG TEST UR: NEGATIVE

## 2015-09-14 NOTE — MAU Note (Signed)
Urine in lab 

## 2015-09-14 NOTE — MAU Note (Signed)
Pt presents to MAU stating she wants to know if she is pregnant. States she is having bleeding every month but it is heavier than normal. 3 negative pregnancy test at home. States she feels movement in her abdomen.

## 2015-09-14 NOTE — MAU Provider Note (Signed)
  History     CSN: [id number]  Arrival date and time: 09/14/15 1406   First Provider Initiated Contact with Patient 09/14/15 1433      HPI patient presents to "make sure she is not pregnant." Taking OCPs regularly and has not skipped any doses. She took 2-3 tests at home and they were negative. She reports feeling the sensation of movement.   OB History    Gravida Para Term Preterm AB TAB SAB Ectopic Multiple Living   4 3 3  0 1 0 0 0 0 3      Past Medical History  Diagnosis Date  . Anemia   . Chlamydia   . Headache(784.0)   . Hypertension     no meds  . Pregnancy induced hypertension     Past Surgical History  Procedure Laterality Date  . Hand surgery      Family History  Problem Relation Age of Onset  . Hypertension    . Diabetes      Social History  Substance Use Topics  . Smoking status: Former Smoker    Types: Cigarettes  . Smokeless tobacco: Never Used  . Alcohol Use: No    Allergies: No Known Allergies  Prescriptions prior to admission  Medication Sig Dispense Refill Last Dose  . acetaminophen (TYLENOL) 500 MG tablet Take 2 tablets (1,000 mg total) by mouth every 6 (six) hours as needed for mild pain. 30 tablet 0   . hydrOXYzine (ATARAX/VISTARIL) 25 MG tablet Take 1 tablet (25 mg total) by mouth every 6 (six) hours as needed for anxiety. 45 tablet 0   . naproxen (NAPROSYN) 500 MG tablet Take 1 tablet (500 mg total) by mouth 2 (two) times daily. For pain 30 tablet 0   . traZODone (DESYREL) 50 MG tablet Take 1 tablet (50 mg total) by mouth at bedtime as needed for sleep (May repeat x1). For insomnia 30 tablet 0     Review of Systems  Constitutional: Negative for fever.  Respiratory: Negative for cough and shortness of breath.   Gastrointestinal: Negative for nausea and vomiting.  Musculoskeletal: Negative for myalgias.  Skin: Negative for rash.  Neurological: Negative for dizziness, tingling and headaches.  Endo/Heme/Allergies: Does not bruise/bleed  easily.  Psychiatric/Behavioral: Negative for depression and suicidal ideas. The patient is not nervous/anxious.    Physical Exam   Blood pressure 110/58, pulse 86, temperature 97.9 F (36.6 C), resp. rate 18, last menstrual period 08/22/2015.  Physical Exam  Constitutional: She appears well-developed and well-nourished.  HENT:  Head: Normocephalic.  Cardiovascular: Normal rate.   Respiratory: Effort normal.  GI: Soft.  Neurological: She is alert.   MAU Course  Procedures  MDM UPT neg  Assessment and Plan  UPT negative Discussed that with regular OCP use and negative home preg test she can be reassured that she is not pregnant Discussed establishing care with PCP for future concerns.  Patient asked "what that moving feeling is" and I explained that I cannot but sure but it is not a pregnancy. I explained normal peristalsis and possible constipation. She seemed satisfied.   Isa RankinKimberly Niles [facility] 09/14/2015, 2:33 PM

## 2015-11-06 ENCOUNTER — Encounter (HOSPITAL_COMMUNITY): Payer: Self-pay

## 2015-11-06 ENCOUNTER — Emergency Department (HOSPITAL_COMMUNITY)
Admission: EM | Admit: 2015-11-06 | Discharge: 2015-11-06 | Disposition: A | Discharge status: Home / Self Care | Payer: Medicaid Other | Attending: Emergency Medicine | Admitting: Emergency Medicine

## 2015-11-06 DIAGNOSIS — Z87891 Personal history of nicotine dependence: Secondary | ICD-10-CM | POA: Insufficient documentation

## 2015-11-06 DIAGNOSIS — M436 Torticollis: Secondary | ICD-10-CM | POA: Insufficient documentation

## 2015-11-06 DIAGNOSIS — Z8619 Personal history of other infectious and parasitic diseases: Secondary | ICD-10-CM | POA: Insufficient documentation

## 2015-11-06 DIAGNOSIS — I10 Essential (primary) hypertension: Secondary | ICD-10-CM | POA: Insufficient documentation

## 2015-11-06 DIAGNOSIS — Z862 Personal history of diseases of the blood and blood-forming organs and certain disorders involving the immune mechanism: Secondary | ICD-10-CM | POA: Insufficient documentation

## 2015-11-06 MED ORDER — METHOCARBAMOL 500 MG PO TABS
500.0000 mg | ORAL_TABLET | Freq: Two times a day (BID) | ORAL | Status: DC
Start: 2015-11-06 — End: 2016-10-04

## 2015-11-06 MED ORDER — HYDROCODONE-ACETAMINOPHEN 5-325 MG PO TABS
1.0000 | ORAL_TABLET | Freq: Once | ORAL | Status: AC
  Administered 2015-11-06: 1 via ORAL
  Filled 2015-11-06: qty 1

## 2015-11-06 MED ORDER — METHOCARBAMOL 500 MG PO TABS
500.0000 mg | ORAL_TABLET | Freq: Once | ORAL | Status: AC
  Administered 2015-11-06: 500 mg via ORAL
  Filled 2015-11-06: qty 1

## 2015-11-06 MED ORDER — NAPROXEN 250 MG PO TABS
500.0000 mg | ORAL_TABLET | Freq: Once | ORAL | Status: AC
  Administered 2015-11-06: 500 mg via ORAL
  Filled 2015-11-06: qty 2

## 2015-11-06 MED ORDER — HYDROCODONE-ACETAMINOPHEN 5-325 MG PO TABS: 2.0000 | ORAL_TABLET | ORAL | Status: DC | PRN

## 2015-11-06 MED ORDER — NAPROXEN 500 MG PO TABS
500.0000 mg | ORAL_TABLET | Freq: Two times a day (BID) | ORAL | Status: DC
Start: 2015-11-06 — End: 2017-05-13

## 2015-11-06 NOTE — ED Notes (Signed)
Declined W/C at D/C and was escorted to lobby by RN. 

## 2015-11-06 NOTE — ED Notes (Signed)
Pt reports pain upper middle of back, onset 4 days ago. The pain originated in her neck and radiates down her back. Pain not relieved with OTC meds. Denies back surgery. Pain worse with movement.

## 2015-11-06 NOTE — ED Provider Notes (Signed)
CSN: [id number]     Arrival date & time 11/06/15  1602 History  By signing my name below, I, Evon Slack, attest that this documentation has been prepared under the direction and in the presence of [employer], PA-C. Electronically Signed: Evon Slack, ED Scribe. 11/06/2015. 5:15 PM.    Chief Complaint  Patient presents with  . Back Pain   Patient is a 25 y.o. female presenting with back pain. The history is provided by the patient. No language interpreter was used.  Back Pain Associated symptoms: no fever, no numbness and no weakness    HPI Comments: Meagan [name] is a 25 y.o. female who presents to the Emergency Department complaining of sudden mid back pain onset 3 days prior. Pt states that the pain began in her neck upon awakening and has radiated down to her mid back. She states that the pain is worse when moving her neck and ambulating. Pt denies heavy lifting or injury. Pt states that she tried [place] powder that only provides temporary relief. Pt denies upper extremity numbness or weakness. Denies being pregnant. Last menstrual period was yesterday. She denies any fever, chills, night sweats.  Past Medical History  Diagnosis Date  . Anemia   . Chlamydia   . Headache(784.0)   . Hypertension     no meds  . Pregnancy induced hypertension    Past Surgical History  Procedure Laterality Date  . Hand surgery     Family History  Problem Relation Age of Onset  . Hypertension    . Diabetes     Social History  Substance Use Topics  . Smoking status: Former Smoker    Types: Cigarettes  . Smokeless tobacco: Never Used  . Alcohol Use: No   OB History    Gravida Para Term Preterm AB TAB SAB Ectopic Multiple Living   0 1 0 0 0 0 3     Review of Systems  Constitutional: Negative for fever and chills.  Musculoskeletal: Positive for back pain. Negative for neck pain and neck stiffness.  Neurological: Negative for weakness and numbness.  All other systems  reviewed and are negative.    Allergies  Review of patient's allergies indicates no known allergies.  Home Medications   Prior to Admission medications   Medication Sig Start Date End Date Taking? Authorizing Provider  acetaminophen (TYLENOL) 500 MG tablet Take 2 tablets (1,000 mg total) by mouth every 6 (six) hours as needed for mild pain. 06/29/15   Sanjuana Kava, NP  HYDROcodone-acetaminophen (NORCO/VICODIN) 5-325 MG tablet Take 2 tablets by mouth every 4 (four) hours as needed. 11/06/15   Carleen Rhue Patel-Mills, PA-C  hydrOXYzine (ATARAX/VISTARIL) 25 MG tablet Take 1 tablet (25 mg total) by mouth every 6 (six) hours as needed for anxiety. 06/29/15   Sanjuana Kava, NP  methocarbamol (ROBAXIN) 500 MG tablet Take 1 tablet (500 mg total) by mouth 2 (two) times daily. 11/06/15   Euriah Matlack Patel-Mills, PA-C  naproxen (NAPROSYN) 500 MG tablet Take 1 tablet (500 mg total) by mouth 2 (two) times daily. 11/06/15   Paolina Karwowski Patel-Mills, PA-C  traZODone (DESYREL) 50 MG tablet Take 1 tablet (50 mg total) by mouth at bedtime as needed for sleep (May repeat x1). For insomnia 06/29/15   Sanjuana Kava, NP   BP 129/76 mmHg  Pulse 73  Temp(Src) 98.6 F (37 C) (Oral)  Resp 18  Ht  (1.626 m)  Wt 63.458 kg  BMI 24.00 kg/m2  SpO2  100%  LMP 11/05/2015   Physical Exam  Constitutional: She is oriented to person, place, and time. She appears well-developed and well-nourished. No distress.  HENT:  Head: Normocephalic and atraumatic.  Eyes: Conjunctivae and EOM are normal.  Neck: Normal range of motion. Neck supple. No tracheal deviation present.  Normal range of motion of neck pain and back with movement of neck. No midline cervical or thoracic tenderness to palpation. No reproducible pain with palpation. Pain is worsened with movement looking both left and right. No signs of meningismus. Able to touch chin to chest without difficulty. Able to look at the ceiling without difficulty.  Cardiovascular: Normal rate, regular  rhythm and normal heart sounds.   Regular rate and rhythm. No murmur.  Pulmonary/Chest: Effort normal and breath sounds normal. No respiratory distress. She has no wheezes.    Lungs clear to auscultation bilaterally. No decreased breath sounds.  Musculoskeletal: Normal range of motion.  Neurological: She is alert and oriented to person, place, and time. She has normal strength. No sensory deficit.  Pain with pushing and pulling using upper extremities. No numbness in upper extremities.  Skin: Skin is warm and dry.  Psychiatric: She has a normal mood and affect. Her behavior is normal.  Nursing note and vitals reviewed.   ED Course  Procedures (including critical care time) DIAGNOSTIC STUDIES: Oxygen Saturation is 98% on RA, normal by my interpretation.    COORDINATION OF CARE: 5:15 PM-Discussed treatment plan with pt at bedside and pt agreed to plan.   Labs Review  Labs Reviewed - No data to display  Imaging Review No results found.    EKG Interpretation None      MDM   Final diagnoses:  Torticollis, acute   Patient presents for upper back pain. Actually no lower back pain. Pain is worse with movement. Full range of motion of neck. I do not suspect meningismus. Denies headache, rash, or fever. Patient states she woke up with neck pain which gradually moved to her upper back. Vital signs are stable. Patient was in fetal position and sleeping when I enter the room. This is most likely musculoskeletal pain. Patient was prescribed Robaxin, naproxen, and hydrocodone. I discussed taking muscle relaxers and narcotic pain medication before going to sleep. Return precautions were discussed as well as follow-up. Patient agrees with plan. Filed Vitals:   11/06/15 1609 11/06/15 1804  BP: 119/67 129/76  Pulse: 66 73  Temp: 98.6 F (37 C) 98.6 F (37 C)  Resp: 18 18   I personally performed the services described in this documentation, which was scribed in my presence. The  recorded information has been reviewed and is accurate.      Catha Gosselin, PA-C 11/06/15 1921  Gwyneth Sprout, MD 11/08/15 [phone]

## 2015-11-06 NOTE — Discharge Instructions (Signed)
Acute Torticollis Follow-up with the doctor using resource Below. Take muscle relaxers and narcotic pain medications before sleeping. Do not drive or operate machinery when using these medications. Take naproxen as an anti-inflammatory. Torticollis is a condition in which the muscles of the neck tighten (contract) abnormally, causing the neck to twist and the head to move into an unnatural position. Torticollis that develops suddenly is called acute torticollis. If torticollis becomes chronic and is left untreated, the face and neck can become deformed. CAUSES This condition may be caused by:  Sleeping in an awkward position (common).  Extending or twisting the neck muscles beyond their normal position.  Infection. In some cases, the cause may not be known. SYMPTOMS Symptoms of this condition include:  An unnatural position of the head.  Neck pain.  A limited ability to move the neck.  Twisting of the neck to one side. DIAGNOSIS This condition is diagnosed with a physical exam. You may also have imaging tests, such as an X-ray, CT scan, or MRI. TREATMENT Treatment for this condition involves trying to relax the neck muscles. It may include:  Medicines or shots.  Physical therapy.  Surgery. This may be done in severe cases. HOME CARE INSTRUCTIONS  Take medicines only as directed by your health care provider.  Do stretching exercises and massage your neck as directed by your health care provider.  Keep all follow-up visits as directed by your health care provider. This is important. SEEK MEDICAL CARE IF:  You develop a fever. SEEK IMMEDIATE MEDICAL CARE IF:  You develop difficulty breathing.  You develop noisy breathing (stridor).  You start drooling.  You have trouble swallowing or have pain with swallowing.  You develop numbness or weakness in your hands or feet.  You have changes in your speech, understanding, or vision.  Your pain gets worse.   This  information is not intended to replace advice given to you by your health care provider. Make sure you discuss any questions you have with your health care provider.   Document Released: 09/15/2000 Document Revised: 02/02/2015 Document Reviewed: 09/14/2014 Elsevier Interactive Patient Education 2016 [employer].  Emergency Department Resource Guide 1) Find a Doctor and Pay Out of Pocket Although you won't have to find out who is covered by your insurance plan, it is a good idea to ask around and get recommendations. You will then need to call the office and see if the doctor you have chosen will accept you as a new patient and what types of options they offer for patients who are self-pay. Some doctors offer discounts or will set up payment plans for their patients who do not have insurance, but you will need to ask so you aren't surprised when you get to your appointment.  2) Contact Your Local Health Department Not all health departments have doctors that can see patients for sick visits, but many do, so it is worth a call to see if yours does. If you don't know where your local health department is, you can check in your phone book. The CDC also has a tool to help you locate your state's health department, and many state websites also have listings of all of their local health departments.  3) Find a Walk-in Clinic If your illness is not likely to be very severe or complicated, you may want to try a walk in clinic. These are popping up all over the country in pharmacies, drugstores, and shopping centers. They're usually staffed by nurse practitioners or  physician assistants that have been trained to treat common illnesses and complaints. They're usually fairly quick and inexpensive. However, if you have serious medical issues or chronic medical problems, these are probably not your best option.  No Primary Care Doctor: - Call Health Connect at  [phone] - they can help you locate a primary care  doctor that  accepts your insurance, provides certain services, etc. - Physician Referral Service- [phone]  Chronic Pain Problems: Organization         Address  Phone   Notes  [facility]  [phone] Patients need to be referred by their primary care doctor.   Medication Assistance: Organization         Address  Phone   Notes  [facility] Medication [facility] [address] [place] [place], [place] [address] [phone] --Must be a resident of [facility] -- Must have NO insurance coverage whatsoever (no Medicaid/ Medicare, etc.) -- The pt. MUST have a primary care doctor that directs their care regularly and follows them in the community   [facility]  [phone]   [employer]  [phone]    Agencies that provide inexpensive medical care: Organization         Address  Phone   Notes  [facility]  [phone]   [facility]    [phone]   [facility] [address] [place], [place] [address] [phone]   [facility] [address] [place] [place] [phone]   [facility]anned Parenthood    [phone]   [facility]    [phone]   [facility]  [address], [place] Phone:  [phone], Fax:  [phone] Hours of Operation:  9 am - 6 pm, M-F.  Also accepts Medicaid/Medicare and self-pay.  [facility]  [address], [place] Phone: [phone], Fax: [phone]. Hours of Operation:  8:30 am - 5:30 pm, M-F.  Also accepts Medicaid and self-pay.  [facility] [place] [address], [place] Phone: [phone]   [facility] [address] Natasha Bence [place], [place] [phone], Ext. [phone] Mondays & Thursdays: 7-9 AM.  First 15 patients are seen on a first come, first serve basis.    Medicaid-accepting [facility]  Providers:  Organization         Address  Phone   Notes  [facility] [address], [place] [phone] Also accepts self-pay patients.  [facility] [address] Laurell Josephs [place], [place]  [phone]   [facility] [address], [place] [phone]   [facility] [address], [place] [phone]   Renaye Rakers [address], [place]   [phone] Only accepts [place] [facility] [place] patients after they have their name applied to their card.   Self-Pay (no insurance) in [facility]:  Organization         Address  Phone   Notes  Sickle Cell Patients, [facility] [address] [place], [place] [phone]   [facility] [address] [place], [place] [phone]   [facility] [place]  [address], [place] [phone]   [facility]/Dr. Julio Sicks  [address] [place] [address], [place] or [address], [place] [phone] Phone number for both [place] and [place] locations is the same.  °[facility] [address], [place] [phone]   °[facility] [address] [place] [address], [place] or [address] ([phone]) [phone] °([phone]) [phone]   °[facility] [address], [place] [phone]) [phone], phone; [phone], fax Sees patients 1st and 3rd Saturday of every month.  Must not qualify for public or private insurance (i.e. Medicaid, Medicare, [place] Health Choice, Veterans' Benefits) • Household income should be no more than 200% of the poverty level •The clinic cannot treat you if you are pregnant or think you are pregnant • Sexually transmitted diseases are not treated at the clinic.  ° ° °Dental Care: °Organization         Address  Phone  Notes  °[facility] [address], [place] ([phone]) [phone] Accepts children up to age 21 who are enrolled in Medicaid or [place] Health Choice; pregnant women with a Medicaid card; and children who have applied for Medicaid or [place] Health Choice, but were declined, whose parents can pay a reduced fee at time of service.  °[facility] [place]  [address], [place] ([phone]) [phone] Accepts children up to age 21 who are enrolled in Medicaid or [place] Health Choice; pregnant women with a Medicaid card; and children who have applied for Medicaid or [place] Health Choice, but were declined, whose parents can pay a reduced fee at time of service.  °[facility] ° [address], [place] [phone]) [phone] Patients are seen by appointment only. Walk-ins are not accepted. [facility] will see patients 18 years of age and older. °Monday - Tuesday (8am-5pm) °Most Wednesdays (8:30-5pm) °$30 per visit, cash only  °[facility] ° [address], [place] [phone]) [phone] Patients are seen by appointment only. Walk-ins are not accepted. [facility] will see patients 18 years of age and older. °One Wednesday Evening (Monthly: Volunteer Based).  $30 per visit, cash only  °[facility]  ([phone] for adults; Children under age 4, call Graduate Pediatric Dentistry at [phone]. Children aged 4-14, please call [phone] to request a pediatric application. ° Dental services are provided in all areas of dental care including fillings, crowns and bridges, complete and partial dentures, implants, gum treatment, root canals, and extractions. Preventive care is also provided. Treatment is provided to both adults and children. °Patients are selected via a lottery and there is often a waiting list. °  °[facility] [address], °[place] ° ([phone]) [phone] [email or web address] °  °[facility]  [address], [place], [place] ([phone], Ext. [phone] Second and Fourth Thursday of each month, opens at 6:30 AM; Clinic ends at 9 AM.  Patients are seen on a first-come first-served basis, and a limited number are seen during each clinic.  ° °[facility] ° [address], [place], [place] [phone]) [phone]   Eligibility Requirements °You must have lived in [place], [place], or [place] counties for at least the last three months. °  You cannot be eligible for state or federal sponsored healthcare insurance, including Veterans Administration, Medicaid, or Medicare. °  You generally cannot be eligible for healthcare insurance through your employer.  °  How   to apply: °Eligibility screenings are held every Tuesday and Wednesday afternoon from 1:00 pm until 4:00 pm. You do not need an appointment for the interview!  °[facility] [address], [place], [place] [phone]   °[facility]  336-342-8273   °[facility]  336-703-3100   °[facility]  336-570-6415   ° °Behavioral Health Resources in the Community: °Intensive Outpatient Programs °Organization         Address  Phone  Notes  °[facility] [address], [place], [place] [phone]   °[facility] [address], [place], [place] 336-832-9800   °[facility] [address], [place], [place] ° 336-882-2125   °[facility] [address],  °[place], [place] 1-800-853-5163 or 336-641-4981   °Substance Abuse Resources °Organization         Address  Phone  Notes  °[facility]  336-882-2125   °[facility]  336-784-9470   °[facility]  336-285-9073   °[facility]  336-845-3988   °Residential & Outpatient Substance Abuse Program  1-800-659-3381   °Psychological Services °Organization         Address  Phone  Notes  ° Health  336- 832-9600    °[facility]  336- 378-7881   °[facility] [address], [place] 1-800-853-5163 or 336-641-4981   ° °Mobile Crisis Teams °Organization         Address  Phone  Notes  °[facility], Mobile Crisis Care Unit  1-877-626-1772   °Assertive °[facility] ° [address] [place], [place] 336-834-9664   °Sharon DeEsch [address] °[place] 336-554-5454   ° °Self-Help/Support Groups °Organization         Address  Phone             Notes  °[facility] [place] - variety of support groups  336- 373-1402 Call for more information  °Narcotics Anonymous (NA), [facility] [address], °[place]  2 meetings at this location  ° °Residential Treatment Programs °Organization         Address  Phone  Notes  °[facility] [address],    °[place]  1-866-801-8205   °[facility] ° [address], [place], [place] 704-293-8524   °[facility] [address], [place] 336-845-3988 Admissions: 8am-3pm M-F  °[facility] [address]    °[place], [place] 336-841-1104   °[facility] [address], [place], [place] 336-379-7146   °[facility] [address]  °[place], [place] 336-285-9073   °[facility] - Intensive Outpatient [address], [place], [place] 336-852-3033   °[facility] [address]  °[place], [place] 1-877-615-2722 or 336-784-9470   °[facility] ([facility]) [address] [place], [place] 336-227-7417 Accepts Medicaid  °[facility] [address]  ° [place] 1-800-659-3381 Substance Abuse/Addiction Treatment  ° °[place] [facility] °Organization         Address  Phone  Notes  °[facility]  (888) 581-9988   °Julie Brannon, PhD [address] [place],    (336) 349-5553 or (336) 951-0000    °[facility]   [address] °[place],  (336) 349-4454   °[facility] [address],   [place], [place] 618-430-1769 Insurance/Medicaid/sponsorship through [facility] [address], [place], [place] [phone] [place] [address] [place] [address]   [place], [place] [phone]    Dr. Adele Schilder  [phone]   [facility] Dept. 1) [address], [place] 2) [place] 3)  [address], [place] [phone]  [phone]   [place] [phone])[phone] or [phone] (After Hours)

## 2015-11-06 NOTE — ED Notes (Signed)
Pt placed into gown 

## 2016-04-03 ENCOUNTER — Encounter (HOSPITAL_COMMUNITY): Payer: Self-pay

## 2016-04-03 ENCOUNTER — Emergency Department (HOSPITAL_COMMUNITY)
Admission: EM | Admit: 2016-04-03 | Discharge: 2016-04-03 | Disposition: A | Discharge status: Home / Self Care | Payer: Medicaid Other | Attending: Emergency Medicine | Admitting: Emergency Medicine

## 2016-04-03 DIAGNOSIS — Y999 Unspecified external cause status: Secondary | ICD-10-CM | POA: Insufficient documentation

## 2016-04-03 DIAGNOSIS — S0592XA Unspecified injury of left eye and orbit, initial encounter: Secondary | ICD-10-CM

## 2016-04-03 DIAGNOSIS — Y929 Unspecified place or not applicable: Secondary | ICD-10-CM | POA: Insufficient documentation

## 2016-04-03 DIAGNOSIS — I10 Essential (primary) hypertension: Secondary | ICD-10-CM | POA: Insufficient documentation

## 2016-04-03 DIAGNOSIS — Y9389 Activity, other specified: Secondary | ICD-10-CM | POA: Insufficient documentation

## 2016-04-03 DIAGNOSIS — Z87891 Personal history of nicotine dependence: Secondary | ICD-10-CM | POA: Insufficient documentation

## 2016-04-03 MED ORDER — FLUORESCEIN SODIUM 1 MG OP STRP
1.0000 | ORAL_STRIP | Freq: Once | OPHTHALMIC | Status: AC
  Administered 2016-04-03: 1 via OPHTHALMIC
  Filled 2016-04-03: qty 1

## 2016-04-03 MED ORDER — ACETAMINOPHEN 325 MG PO TABS
650.0000 mg | ORAL_TABLET | Freq: Once | ORAL | Status: AC
  Administered 2016-04-03: 650 mg via ORAL
  Filled 2016-04-03: qty 2

## 2016-04-03 MED ORDER — TETRACAINE HCL 0.5 % OP SOLN
1.0000 [drp] | Freq: Once | OPHTHALMIC | Status: AC
Start: 2016-04-03 — End: 2016-04-03
  Administered 2016-04-03: 1 [drp] via OPHTHALMIC
  Filled 2016-04-03: qty 2

## 2016-04-03 MED ORDER — TRAMADOL HCL 50 MG PO TABS
50.0000 mg | ORAL_TABLET | Freq: Once | ORAL | Status: AC
  Administered 2016-04-03: 50 mg via ORAL
  Filled 2016-04-03: qty 1
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
## 2016-04-03 NOTE — Discharge Instructions (Signed)
Read the information below.   At this time you have declined imaging. If your symptoms do not improve or worsen you can return for imaging. You can continue to use visine as needed. Take ibuprofen or tylenol every 6hrs for pain relief.  Use the prescribed medication as directed.  Please discuss all new medications with your pharmacist.   I have provided the contact information for ophthalmology. If your symptoms do not improve in the next 1-2 days I encouraged you to follow up with ophthalmology. You may return to the Emergency Department at any time for worsening condition or any new symptoms that concern you. Return to the emergency department if your symptoms worsen, your are unable to move your eye, you have worsening vision, or you are unable to open your eye.

## 2016-04-03 NOTE — ED Notes (Signed)
Declined W/C at D/C and was escorted to lobby by RN. 
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
## 2016-04-03 NOTE — ED Notes (Signed)
Patient brought back to room in wheel chair  Patient states she can not open eyes to see the visual acuity chart.

## 2016-10-04 ENCOUNTER — Encounter (HOSPITAL_COMMUNITY): Payer: Self-pay | Admitting: *Deleted

## 2016-10-04 ENCOUNTER — Inpatient Hospital Stay (HOSPITAL_COMMUNITY)
Admission: AD | Admit: 2016-10-04 | Discharge: 2016-10-04 | Disposition: A | Discharge status: Home / Self Care | Payer: Medicaid Other | Source: Ambulatory Visit | Attending: Obstetrics & Gynecology | Admitting: Obstetrics & Gynecology

## 2016-10-04 DIAGNOSIS — Z7982 Long term (current) use of aspirin: Secondary | ICD-10-CM | POA: Insufficient documentation

## 2016-10-04 DIAGNOSIS — I10 Essential (primary) hypertension: Secondary | ICD-10-CM | POA: Insufficient documentation

## 2016-10-04 DIAGNOSIS — Z202 Contact with and (suspected) exposure to infections with a predominantly sexual mode of transmission: Secondary | ICD-10-CM | POA: Insufficient documentation

## 2016-10-04 DIAGNOSIS — R51 Headache: Secondary | ICD-10-CM | POA: Insufficient documentation

## 2016-10-04 DIAGNOSIS — Z87891 Personal history of nicotine dependence: Secondary | ICD-10-CM | POA: Insufficient documentation

## 2016-10-04 DIAGNOSIS — A749 Chlamydial infection, unspecified: Secondary | ICD-10-CM | POA: Insufficient documentation

## 2016-10-04 DIAGNOSIS — N730 Acute parametritis and pelvic cellulitis: Secondary | ICD-10-CM

## 2016-10-04 DIAGNOSIS — D649 Anemia, unspecified: Secondary | ICD-10-CM | POA: Insufficient documentation

## 2016-10-04 DIAGNOSIS — N739 Female pelvic inflammatory disease, unspecified: Secondary | ICD-10-CM | POA: Insufficient documentation

## 2016-10-04 LAB — URINALYSIS, ROUTINE W REFLEX MICROSCOPIC
Bilirubin Urine: NEGATIVE
GLUCOSE, UA: NEGATIVE mg/dL
Hgb urine dipstick: NEGATIVE
KETONES UR: NEGATIVE mg/dL
Leukocytes, UA: NEGATIVE
Nitrite: NEGATIVE
PH: 6 (ref 5.0–8.0)
Protein, ur: NEGATIVE mg/dL
Specific Gravity, Urine: 1.029 (ref 1.005–1.030)

## 2016-10-04 LAB — WET PREP, GENITAL
SPERM: NONE SEEN
Trich, Wet Prep: NONE SEEN
Yeast Wet Prep HPF POC: NONE SEEN

## 2016-10-04 LAB — POCT PREGNANCY, URINE: Preg Test, Ur: NEGATIVE

## 2016-10-04 MED ORDER — METRONIDAZOLE 500 MG PO TABS
2000.0000 mg | ORAL_TABLET | Freq: Once | ORAL | Status: AC
  Administered 2016-10-04: 2000 mg via ORAL
  Filled 2016-10-04: qty 4

## 2016-10-04 NOTE — Discharge Instructions (Signed)
Chlamydia, Female Chlamydia is an infection. It is spread from one person to another person during sexual contact. This infection can be in the cervix, urine tube (urethra), throat, or bottom (rectum). This infection needs treatment. HOME CARE   Take your medicines (antibiotics) as told. Finish them even if you start to feel better.  Only take medicine as told by your doctor.  Tell your sex partner(s) that you have chlamydia. They must also be treated.  Do not have sex until your doctor says it is okay.  Rest.  Eat healthy. Drink enough fluids to keep your pee (urine) clear or pale yellow.  Keep all doctor visits as told. GET HELP IF:  You have pain when you pee.  You have belly pain.  You have vaginal discharge.  You have pain during sex.  You have bleeding between periods and after sex.  You have a fever. GET HELP RIGHT AWAY IF:   You feel sick to your stomach (nauseous) or you throw up (vomit).  You sweat much more than normal (diaphoresis).  You have trouble swallowing. This information is not intended to replace advice given to you by your health care provider. Make sure you discuss any questions you have with your health care provider. Document Released: 06/27/2008 Document Revised: 01/10/2016 Document Reviewed: 05/26/2013 Elsevier Interactive Patient Education  2017 [employer]. Trichomoniasis Trichomoniasis is an infection caused by an organism called Trichomonas. The infection can affect both women and men. In women, the outer female genitalia and the vagina are affected. In men, the penis is mainly affected, but the prostate and other reproductive organs can also be involved. Trichomoniasis is a sexually transmitted infection (STI) and is most often passed to another person through sexual contact.  RISK FACTORS  Having unprotected sexual intercourse.  Having sexual intercourse with an infected partner. SIGNS AND SYMPTOMS  Symptoms of trichomoniasis in women  include:  Abnormal gray-green frothy vaginal discharge.  Itching and irritation of the vagina.  Itching and irritation of the area outside the vagina. Symptoms of trichomoniasis in men include:   Penile discharge with or without pain.  Pain during urination. This results from inflammation of the urethra. DIAGNOSIS  Trichomoniasis may be found during a Pap test or physical exam. Your health care provider may use one of the following methods to help diagnose this infection:  Testing the pH of the vagina with a test tape.  Using a vaginal swab test that checks for the Trichomonas organism. A test is available that provides results within a few minutes.  Examining a urine sample.  Testing vaginal secretions. Your health care provider may test you for other STIs, including HIV. TREATMENT   You may be given medicine to fight the infection. Women should inform their health care provider if they could be or are pregnant. Some medicines used to treat the infection should not be taken during pregnancy.  Your health care provider may recommend over-the-counter medicines or creams to decrease itching or irritation.  Your sexual partner will need to be treated if infected.  Your health care provider may test you for infection again 3 months after treatment. HOME CARE INSTRUCTIONS   Take medicines only as directed by your health care provider.  Take over-the-counter medicine for itching or irritation as directed by your health care provider.  Do not have sexual intercourse while you have the infection.  Women should not douche or wear tampons while they have the infection.  Discuss your infection with your partner. Your  partner may have gotten the infection from you, or you may have gotten it from your partner.  Have your sex partner get examined and treated if necessary.  Practice safe, informed, and protected sex.  See your health care provider for other STI testing. SEEK MEDICAL  CARE IF:   You still have symptoms after you finish your medicine.  You develop abdominal pain.  You have pain when you urinate.  You have bleeding after sexual intercourse.  You develop a rash.  Your medicine makes you sick or makes you throw up (vomit). MAKE SURE YOU:  Understand these instructions.  Will watch your condition.  Will get help right away if you are not doing well or get worse. This information is not intended to replace advice given to you by your health care provider. Make sure you discuss any questions you have with your health care provider. Document Released: 03/14/2001 Document Revised: 10/09/2014 Document Reviewed: 06/30/2013 Elsevier Interactive Patient Education  2017 [employer] Inc[employer]  Pelvic Inflammatory Disease Introduction Pelvic inflammatory disease (PID) is an infection in some or all of the female organs. PID can be in the uterus, ovaries, fallopian tubes, or the surrounding tissues that are inside the lower belly area (pelvis). PID can lead to lasting problems if it is not treated. To check for this disease, your doctor may:  Do a physical exam.  Do blood tests, urine tests, or a pregnancy test.  Look at your vaginal discharge.  Do tests to look inside the pelvis.  Test you for other infections. Follow these instructions at home:  Take over-the-counter and prescription medicines only as told by your doctor.  If you were prescribed an antibiotic medicine, take it as told by your doctor. Do not stop taking it even if you start to feel better.  Do not have sex until treatment is done or as told by your doctor.  Tell your sex partner if you have PID. Your partner may need to be treated.  Keep all follow-up visits as told by your doctor. This is important.  Your doctor may test you for infection again 3 months after you are treated. Contact a doctor if:  You have more fluid (discharge) coming from your vagina or fluid that is not  normal.  Your pain does not improve.  You throw up (vomit).  You have a fever.  You cannot take your medicines.  Your partner has a sexually transmitted disease (STD).  You have pain when you pee (urinate). Get help right away if:  You have more belly (abdominal) or lower belly pain.  You have chills.  You are not better after 72 hours. This information is not intended to replace advice given to you by your health care provider. Make sure you discuss any questions you have with your health care provider. Document Released: 12/15/2008 Document Revised: 02/24/2016 Document Reviewed: 10/26/2014  2017 Elsevier

## 2016-10-04 NOTE — MAU Note (Signed)
Boyfriend in jail for 3 months.  Has been taking preg tests, they have all been negative, has been having periods. [facility].  Thinks something must be wrong. Feels tightness when she coughs, has had some abd pain. Doesn't believe it.  Is on Depo, first shot was first week in Dec
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
## 2016-10-30 ENCOUNTER — Inpatient Hospital Stay (HOSPITAL_COMMUNITY)
Admission: AD | Admit: 2016-10-30 | Discharge: 2016-10-30 | Disposition: A | Discharge status: Home / Self Care | Payer: Medicaid Other | Source: Ambulatory Visit | Attending: Obstetrics & Gynecology | Admitting: Obstetrics & Gynecology

## 2016-10-30 DIAGNOSIS — Z5321 Procedure and treatment not carried out due to patient leaving prior to being seen by health care provider: Secondary | ICD-10-CM | POA: Insufficient documentation

## 2016-10-30 DIAGNOSIS — R109 Unspecified abdominal pain: Secondary | ICD-10-CM | POA: Insufficient documentation

## 2016-10-30 LAB — URINALYSIS, ROUTINE W REFLEX MICROSCOPIC
BILIRUBIN URINE: NEGATIVE
Glucose, UA: NEGATIVE mg/dL
KETONES UR: NEGATIVE mg/dL
LEUKOCYTES UA: NEGATIVE
Nitrite: NEGATIVE
PROTEIN: NEGATIVE mg/dL
SPECIFIC GRAVITY, URINE: 1.026 (ref 1.005–1.030)
pH: 6 (ref 5.0–8.0)

## 2016-10-30 LAB — POCT PREGNANCY, URINE: PREG TEST UR: NEGATIVE

## 2016-10-30 NOTE — MAU Note (Signed)
Pt not in lobby.  

## 2016-10-30 NOTE — MAU Note (Signed)
Not in lobby

## 2016-10-30 NOTE — MAU Note (Signed)
Pt presents to MAU with complaints of feeling something moving in her abdomen. Pt was evaluated in MAU on Jan the 3rd and pregnancy test was negative. PT states she thinks she is pregnant but is having heavy bleeding with lower abdominal pain

## 2016-10-31 ENCOUNTER — Inpatient Hospital Stay (HOSPITAL_COMMUNITY)
Admission: AD | Admit: 2016-10-31 | Discharge: 2016-10-31 | Disposition: A | Discharge status: Home / Self Care | Payer: Medicaid Other | Source: Ambulatory Visit | Attending: Obstetrics and Gynecology | Admitting: Obstetrics and Gynecology

## 2016-10-31 DIAGNOSIS — Z7982 Long term (current) use of aspirin: Secondary | ICD-10-CM | POA: Insufficient documentation

## 2016-10-31 DIAGNOSIS — I10 Essential (primary) hypertension: Secondary | ICD-10-CM | POA: Insufficient documentation

## 2016-10-31 DIAGNOSIS — D649 Anemia, unspecified: Secondary | ICD-10-CM | POA: Insufficient documentation

## 2016-10-31 DIAGNOSIS — R51 Headache: Secondary | ICD-10-CM | POA: Insufficient documentation

## 2016-10-31 DIAGNOSIS — A749 Chlamydial infection, unspecified: Secondary | ICD-10-CM | POA: Insufficient documentation

## 2016-10-31 DIAGNOSIS — Z87891 Personal history of nicotine dependence: Secondary | ICD-10-CM | POA: Insufficient documentation

## 2016-10-31 DIAGNOSIS — R140 Abdominal distension (gaseous): Secondary | ICD-10-CM | POA: Insufficient documentation

## 2016-10-31 LAB — POCT PREGNANCY, URINE: PREG TEST UR: NEGATIVE

## 2016-10-31 LAB — URINALYSIS, ROUTINE W REFLEX MICROSCOPIC
BILIRUBIN URINE: NEGATIVE
Bacteria, UA: NONE SEEN
Glucose, UA: NEGATIVE mg/dL
Ketones, ur: NEGATIVE mg/dL
LEUKOCYTES UA: NEGATIVE
Nitrite: NEGATIVE
PROTEIN: NEGATIVE mg/dL
Specific Gravity, Urine: 1.023 (ref 1.005–1.030)
pH: 6 (ref 5.0–8.0)

## 2016-10-31 LAB — HCG, SERUM, QUALITATIVE: Preg, Serum: NEGATIVE

## 2016-10-31 NOTE — Discharge Instructions (Signed)
Irritable Bowel Syndrome, Adult Irritable bowel syndrome (IBS) is not one specific disease. It is a group of symptoms that affects the organs responsible for digestion (gastrointestinal or GI tract). To regulate how your GI tract works, your body sends signals back and forth between your intestines and your brain. If you have IBS, there may be a problem with these signals. As a result, your GI tract does not function normally. Your intestines may become more sensitive and overreact to certain things. This is especially true when you eat certain foods or when you are under stress. There are four types of IBS. These may be determined based on the consistency of your stool:  IBS with diarrhea.  IBS with constipation.  Mixed IBS.  Unsubtyped IBS. It is important to know which type of IBS you have. Some treatments are more likely to be helpful for certain types of IBS. What are the causes? The exact cause of IBS is not known. What increases the risk? You may have a higher risk of IBS if:  You are a woman.  You are younger than 26 years old.  You have a family history of IBS.  You have mental health problems.  You have had bacterial infection of your GI tract. What are the signs or symptoms? Symptoms of IBS vary from person to person. The main symptom is abdominal pain or discomfort. Additional symptoms usually include one or more of the following:  Diarrhea, constipation, or both.  Abdominal swelling or bloating.  Feeling full or sick after eating a small or regular-size meal.  Frequent gas.  Mucus in the stool.  A feeling of having more stool left after a bowel movement. Symptoms tend to come and go. They may be associated with stress, psychiatric conditions, or nothing at all. How is this diagnosed? There is no specific test to diagnose IBS. Your health care provider will make a diagnosis based on a physical exam, medical history, and your symptoms. You may have other tests to  rule out other conditions that may be causing your symptoms. These may include:  Blood tests.  X-rays.  CT scan.  Endoscopy and colonoscopy. This is a test in which your GI tract is viewed with a long, thin, flexible tube. How is this treated? There is no cure for IBS, but treatment can help relieve symptoms. IBS treatment often includes:  Changes to your diet, such as:  Eating more fiber.  Avoiding foods that cause symptoms.  Drinking more water.  Eating regular, medium-sized portioned meals.  Medicines. These may include:  Fiber supplements if you have constipation.  Medicine to control diarrhea (antidiarrheal medicines).  Medicine to help control muscle spasms in your GI tract (antispasmodic medicines).  Medicines to help with any mental health issues, such as antidepressants or tranquilizers.  Therapy.  Talk therapy may help with anxiety, depression, or other mental health issues that can make IBS symptoms worse.  Stress reduction.  Managing your stress can help keep symptoms under control. Follow these instructions at home:  Take medicines only as directed by your health care provider.  Eat a healthy diet.  Avoid foods and drinks with added sugar.  Include more whole grains, fruits, and vegetables gradually into your diet. This may be especially helpful if you have IBS with constipation.  Avoid any foods and drinks that make your symptoms worse. These may include dairy products and caffeinated or carbonated drinks.  Do not eat large meals.  Drink enough fluid to keep your urine   clear or pale yellow.  Exercise regularly. Ask your health care provider for recommendations of good activities for you.  Keep all follow-up visits as directed by your health care provider. This is important. Contact a health care provider if:  You have constant pain.  You have trouble or pain with swallowing.  You have worsening diarrhea. Get help right away if:  You  have severe and worsening abdominal pain.  You have diarrhea and:  You have a rash, stiff neck, or severe headache.  You are irritable, sleepy, or difficult to awaken.  You are weak, dizzy, or extremely thirsty.  You have bright red blood in your stool or you have black tarry stools.  You have unusual abdominal swelling that is painful.  You vomit continuously.  You vomit blood (hematemesis).  You have both abdominal pain and a fever. This information is not intended to replace advice given to you by your health care provider. Make sure you discuss any questions you have with your health care provider. Document Released: 09/18/2005 Document Revised: 02/18/2016 Document Reviewed: 06/05/2014 Elsevier Interactive Patient Education  2017 [employer] Diet for Irritable Bowel Syndrome Introduction When you have irritable bowel syndrome (IBS), the foods you eat and your eating habits are very important. IBS may cause various symptoms, such as abdominal pain, constipation, or diarrhea. Choosing the right foods can help ease discomfort caused by these symptoms. Work with your health care provider and dietitian to find the best eating plan to help control your symptoms. What general guidelines do I need to follow?  Keep a food diary. This will help you identify foods that cause symptoms. Write down:  What you eat and when.  What symptoms you have.  When symptoms occur in relation to your meals.  Avoid foods that cause symptoms. Talk with your dietitian about other ways to get the same nutrients that are in these foods.  Eat more foods that contain fiber. Take a fiber supplement if directed by your dietitian.  Eat your meals slowly, in a relaxed setting.  Aim to eat 5-6 small meals per day. Do not skip meals.  Drink enough fluids to keep your urine clear or pale yellow.  Ask your health care provider if you should take an over-the-counter probiotic during flare-ups to help  restore healthy gut bacteria.  If you have cramping or diarrhea, try making your meals low in fat and high in carbohydrates. Examples of carbohydrates are pasta, rice, whole grain breads and cereals, fruits, and vegetables.  If dairy products cause your symptoms to flare up, try eating less of them. You might be able to handle yogurt better than other dairy products because it contains bacteria that help with digestion. What foods are not recommended? The following are some foods and drinks that may worsen your symptoms:  Fatty foods, such as French fries.  Milk products, such as cheese or ice cream.  Chocolate.  Alcohol.  Products with caffeine, such as coffee.  Carbonated drinks, such as soda. The items listed above may not be a complete list of foods and beverages to avoid. Contact your dietitian for more information.  What foods are good sources of fiber? Your health care provider or dietitian may recommend that you eat more foods that contain fiber. Fiber can help reduce constipation and other IBS symptoms. Add foods with fiber to your diet a little at a time so that your body can get used to them. Too much fiber at once might cause gas and   swelling of your abdomen. The following are some foods that are good sources of fiber:  Apples.  Peaches.  Pears.  Berries.  Figs.  Broccoli (raw).  Cabbage.  Carrots.  Raw peas.  Kidney beans.  Lima beans.  Whole grain bread.  Whole grain cereal. Where to find more information: International Foundation for Functional Gastrointestinal Disorders: www.iffgd.org National Institute of Diabetes and Digestive and Kidney Diseases: www.niddk.nih.gov/health-information/health-topics/digestive-diseases/ibs/Pages/facts.aspx This information is not intended to replace advice given to you by your health care provider. Make sure you discuss any questions you have with your health care provider. Document Released: 12/09/2003 Document  Revised: 02/24/2016 Document Reviewed: 12/19/2013  2017 Elsevier  

## 2016-10-31 NOTE — MAU Note (Signed)
Pt reports she keeps having "movement" in her stomach and she has been here multiple times and she just wants to make sure she is not pregnant.

## 2016-11-01 NOTE — MAU Provider Note (Signed)
Chief Complaint: No chief complaint on file.   First Provider Initiated Contact with Patient 10/31/16 2133     SUBJECTIVE HPI: Meagan [name] is a 26 y.o. [phone] female who presents to Maternity Admissions reporting possible pregnancy and concern for ectopic pregnancy. State she feels something moving around the level of her umbilicus. Has had multiple [facility] and [facility] visits w/ same concerns and had all negative pregnancy tests. Her boyfriend has been in prison for more that three months and she states she had not been sexually active with anyone else. Last Depo December. Was Dx'd w/ Chlamydia at [facility] ~1 month ago and treated. States they tested her again and the result was negative. Declines STD testing today. Patient's last menstrual period was 10/23/2016.   Location: Umbilicus Quality: movement Severity: Reported 10/10 on pain scale to RN, but told CNM that it wasn't painful.  Duration: 1-2 months Context: none Timing: intermittent Modifying factors: None. Not related to eating certain food that she is aware of.  Associated signs and symptoms: Neg for fever, chills, vaginal bleeding, vaginal discharge, N/V/D/C or urinary complaints. Having menstrual periods.   Past Medical History:  Diagnosis Date  . Anemia   . Chlamydia   . Headache(784.0)   . Hypertension    no meds  . Pregnancy induced hypertension    OB History  Gravida Para Term Preterm AB Living  4 3 3  0 1 3  SAB TAB Ectopic Multiple Live Births  0 0 0 0 3    # Outcome Date GA Lbr Len/2nd Weight Sex Delivery Anes PTL Lv  4 AB 11/14/14     TAB     3 Term 04/18/13 [redacted]w[redacted]d 06:02 / 00:36 6 lb 15.5 oz (3.16 kg) M Vag-Spont EPI  LIV     Birth Comments: None  2 Term 02/18/10    M Vag-Spont   LIV  1 Term 10/23/08    Genella Mech   LIV     Past Surgical History:  Procedure Laterality Date  . HAND SURGERY     Social History   Social History  . Marital status: Single    Spouse name: N/A  .  Number of children: 2  . Years of education: N/A   Occupational History  .  [employer]   Social History Main Topics  . Smoking status: Former Smoker    Types: Cigarettes  . Smokeless tobacco: Never Used  . Alcohol use No  . Drug use: No  . Sexual activity: Not Currently    Partners: Male    Birth control/ protection: Injection   Other Topics Concern  . Not on file   Social History Narrative  . No narrative on file   Family History  Problem Relation Age of Onset  . Hypertension    . Diabetes     No current facility-administered medications on file prior to encounter.    Current Outpatient Prescriptions on File Prior to Encounter  Medication Sig Dispense Refill  . acetaminophen (TYLENOL) 500 MG tablet Take 2 tablets (1,000 mg total) by mouth every 6 (six) hours as needed for mild pain. 30 tablet 0  . Aspirin-Acetaminophen-Caffeine (GOODY HEADACHE PO) Take 1 packet by mouth daily as needed (headache).    . naproxen (NAPROSYN) 500 MG tablet Take 1 tablet (500 mg total) by mouth 2 (two) times daily. (Patient not taking: Reported on 10/04/2016) 30 tablet 0   No Known Allergies  I have reviewed patient's Past Medical Hx, Surgical Hx, Family  Hx, Social Hx, medications and allergies.   Review of Systems  Constitutional: Negative for appetite change, chills and fever.    OBJECTIVE Patient Vitals for the past 24 hrs:  BP Temp Temp src Pulse Resp SpO2 Height Weight  10/31/16 2151 121/78 98 F (36.7 C) Oral 74 18 - - -  10/31/16 1922 128/82 99.1 F (37.3 C) Oral 77 18 100 % 5\' 6"  (1.676 m) 161 lb (73 kg)   Constitutional: Well-developed, well-nourished female in no acute distress.  Cardiovascular: normal rate Respiratory: normal rate and effort.  GI: Abd soft, non-tender. Neurologic: Alert and oriented x 4.  GU: Refused  LAB RESULTS Results for orders placed or performed during the hospital encounter of 10/31/16 (from the past 24 hour(s))  Urinalysis, Routine w reflex  microscopic     Status: Abnormal   Collection Time: 10/31/16  7:25 PM  Result Value Ref Range   Color, Urine YELLOW YELLOW   APPearance CLEAR CLEAR   Specific Gravity, Urine 1.023 1.005 - 1.030   pH 6.0 5.0 - 8.0   Glucose, UA NEGATIVE NEGATIVE mg/dL   Hgb urine dipstick LARGE (A) NEGATIVE   Bilirubin Urine NEGATIVE NEGATIVE   Ketones, ur NEGATIVE NEGATIVE mg/dL   Protein, ur NEGATIVE NEGATIVE mg/dL   Nitrite NEGATIVE NEGATIVE   Leukocytes, UA NEGATIVE NEGATIVE   RBC / HPF 0-5 0 - 5 RBC/hpf   WBC, UA 0-5 0 - 5 WBC/hpf   Bacteria, UA NONE SEEN NONE SEEN   Squamous Epithelial / LPF 0-5 (A) NONE SEEN  Pregnancy, urine POC     Status: None   Collection Time: 10/31/16  7:37 PM  Result Value Ref Range   Preg Test, Ur NEGATIVE NEGATIVE  hCG, serum, qualitative     Status: None   Collection Time: 10/31/16  8:21 PM  Result Value Ref Range   Preg, Serum NEGATIVE NEGATIVE    IMAGING No results found.  MAU COURSE Orders Placed This Encounter  Procedures  . Urinalysis, Routine w reflex microscopic  . hCG, serum, qualitative  . Pregnancy, urine POC  . Discharge patient   Lengthy discussion with patient that with 3 negative urine pregnancy test and negative serum pregnancy test and having not been sexually active in greater than 3 months we can be extremely certain that she is not pregnant. Considering the location and nature of the sensation she is feeling I suspect that her symptoms are gastrointestinal. She denies pain, is nontoxic-appearing and has a nontender abdomen and is appropriate for outpatient evaluation of this problem.  MDM - Perception of movement in mid abdomen likely due to intestinal gas. No evidence of emergent condition. Pregnancy has been ruled out.  ASSESSMENT 1. Bloating     PLAN Discharge home in stable condition. Patient encouraged to establish care with primary care provider. Contact information for multiple providers given.  Recommended the patient keep  a food log. This might help identify foods that could be causing her symptoms. Follow-up Information    [facility] Follow up.   Contact information: [address] [place] [id number] [phone]       [facility] Follow up.   Contact information: [address] [place] [id number] [phone]       [facility] Follow up.   Why:  for gastrointestinal issues Contact information: [address] [place] [phone]       [facility] EMERGENCY DEPARTMENT Follow up.   Specialty:  Emergency Medicine Why:  as needed in medical emergencies Contact information: [address] [id number] [place] [address] [phone]         Allergies as of 10/31/2016   No Known Allergies     Medication List    TAKE these medications   acetaminophen 500 MG tablet Commonly known as:  TYLENOL Take 2 tablets (1,000 mg total) by mouth every 6 (six) hours as needed for mild pain.   GOODY HEADACHE PO Take 1 packet by mouth daily as needed (headache).   naproxen 500 MG tablet Commonly known as:  NAPROSYN Take 1 tablet (500 mg total) by mouth 2 (two) times daily.        [place], CNM 11/01/2016  12:01 AM

## 2016-11-15 ENCOUNTER — Emergency Department (HOSPITAL_COMMUNITY)
Admission: EM | Admit: 2016-11-15 | Discharge: 2016-11-15 | Disposition: A | Discharge status: Home / Self Care | Payer: Medicaid Other | Attending: Emergency Medicine | Admitting: Emergency Medicine

## 2016-11-15 ENCOUNTER — Encounter (HOSPITAL_COMMUNITY): Payer: Self-pay | Admitting: *Deleted

## 2016-11-15 DIAGNOSIS — R109 Unspecified abdominal pain: Secondary | ICD-10-CM

## 2016-11-15 DIAGNOSIS — Z79899 Other long term (current) drug therapy: Secondary | ICD-10-CM | POA: Insufficient documentation

## 2016-11-15 DIAGNOSIS — N921 Excessive and frequent menstruation with irregular cycle: Secondary | ICD-10-CM | POA: Insufficient documentation

## 2016-11-15 DIAGNOSIS — I10 Essential (primary) hypertension: Secondary | ICD-10-CM | POA: Insufficient documentation

## 2016-11-15 DIAGNOSIS — Z87891 Personal history of nicotine dependence: Secondary | ICD-10-CM | POA: Insufficient documentation

## 2016-11-15 DIAGNOSIS — R1084 Generalized abdominal pain: Secondary | ICD-10-CM | POA: Insufficient documentation

## 2016-11-15 DIAGNOSIS — Z7982 Long term (current) use of aspirin: Secondary | ICD-10-CM | POA: Insufficient documentation

## 2016-11-15 LAB — CBC
HEMATOCRIT: 32.6 % — AB (ref 36.0–46.0)
Hemoglobin: 11.2 g/dL — ABNORMAL LOW (ref 12.0–15.0)
MCH: 23.8 pg — ABNORMAL LOW (ref 26.0–34.0)
MCHC: 34.4 g/dL (ref 30.0–36.0)
MCV: 69.2 fL — ABNORMAL LOW (ref 78.0–100.0)
PLATELETS: 281 10*3/uL (ref 150–400)
RBC: 4.71 MIL/uL (ref 3.87–5.11)
RDW: 18.8 % — AB (ref 11.5–15.5)
WBC: 6.2 10*3/uL (ref 4.0–10.5)

## 2016-11-15 LAB — WET PREP, GENITAL
Clue Cells Wet Prep HPF POC: NONE SEEN
Sperm: NONE SEEN
Trich, Wet Prep: NONE SEEN
Yeast Wet Prep HPF POC: NONE SEEN

## 2016-11-15 LAB — COMPREHENSIVE METABOLIC PANEL
ALBUMIN: 4 g/dL (ref 3.5–5.0)
ALK PHOS: 58 U/L (ref 38–126)
ALT: 21 U/L (ref 14–54)
AST: 17 U/L (ref 15–41)
Anion gap: 7 (ref 5–15)
BILIRUBIN TOTAL: 0.5 mg/dL (ref 0.3–1.2)
BUN: 8 mg/dL (ref 6–20)
CALCIUM: 9.4 mg/dL (ref 8.9–10.3)
CO2: 24 mmol/L (ref 22–32)
CREATININE: 0.62 mg/dL (ref 0.44–1.00)
Chloride: 107 mmol/L (ref 101–111)
GFR calc Af Amer: >60 mL/min (ref >60)
GLUCOSE: 90 mg/dL (ref 65–99)
POTASSIUM: 3.9 mmol/L (ref 3.5–5.1)
Sodium: 138 mmol/L (ref 135–145)
TOTAL PROTEIN: 7.7 g/dL (ref 6.5–8.1)

## 2016-11-15 LAB — I-STAT BETA HCG BLOOD, ED (MC, WL, AP ONLY): I-stat hCG, quantitative: <5 m[IU]/mL (ref <5)

## 2016-11-15 LAB — LIPASE, BLOOD: Lipase: 14 U/L (ref 11–51)

## 2016-11-15 NOTE — ED Provider Notes (Signed)
[facility]-EMERGENCY DEPT Provider Note   CSN: [id number] Arrival date & time: 11/15/16  1059  By signing my name below, I, Rosario Adie, attest that this documentation has been prepared under the direction and in the presence of Lavera Guise, MD. Electronically Signed: Rosario Adie, ED Scribe. 11/15/16. 12:57 PM.  History   Chief Complaint Chief Complaint  Patient presents with  . Abdominal Pain  . Vaginal Bleeding   The history is provided by the patient. No language interpreter was used.   HPI Comments: Meagan [name] is a 26 y.o. female with a h/o anemia, who presents to the Emergency Department complaining of intermittent, severe generalized abdominal pain beginning Dec 1st (~2.5 months ago). She describes her pain as cramping and sharp. Pt reports that she began tolerating Depo Provera injections on Dec 1st and her pain has been present since. She also notes that she has been experiencing increased abnormal heavy vaginal bleeding over the past month as well. Her vaginal bleeding has been improving since two days ago. Pt called her PCP for this issue and was advised that these are normal side effects of her injections. She has also been evaluated several times at [facility], most recently on 10/31/16, for this issue, and each time she has underwent pregnancy screenings which have all been negative. She has had five negative pregnancy screenings since December. [facility] her boyfriend informing her that he had Chlamydia. She was [facility]'d w/ her [facility] ~[facility] month ago, and underwent a trial of antibiotics and was tested again following this which was negative. She was also screened other STDs which were all negative, per pt. Her pain is sometimes precipitated and exacerbated with coughing. No PSHx to the abdomen. She denies abnormal vaginal discharge, nausea, vomiting, diarrhea, fever, urgency, hematuria, dysuria, difficulty  urinating, or any other associated symptoms.   Past Medical History:  Diagnosis Date  . Anemia   . Chlamydia   . Headache(784.0)   . Hypertension    no meds  . Pregnancy induced hypertension    Patient Active Problem List   Diagnosis Date Noted  . MDD (major depressive disorder) 06/29/2015  . Overdose of benzodiazepine   . Benzodiazepine overdose of undetermined intent 06/28/2015  . Major depressive disorder, single episode 06/27/2015   Past Surgical History:  Procedure Laterality Date  . HAND SURGERY     OB History    Gravida Para Term Preterm AB Living   4 3 3  0 1 3   SAB TAB Ectopic Multiple Live Births   0 0 0 0 3     Home Medications    Prior to Admission medications   Medication Sig Start Date End Date Taking? Authorizing Provider  acetaminophen (TYLENOL) 500 MG tablet Take 2 tablets (1,000 mg total) by mouth every 6 (six) hours as needed for mild pain. 06/29/15   Sanjuana Kava, NP  Aspirin-Acetaminophen-Caffeine (GOODY HEADACHE PO) Take 1 packet by mouth daily as needed (headache).    Historical Provider, MD  naproxen (NAPROSYN) 500 MG tablet Take 1 tablet (500 mg total) by mouth 2 (two) times daily. Patient not taking: Reported on 10/04/2016 11/06/15   Catha Gosselin, PA-C   Family History Family History  Problem Relation Age of Onset  . Hypertension    . Diabetes     Social History Social History  Substance Use Topics  . Smoking status: Former Smoker    Types: Cigarettes  .  Smokeless tobacco: Never Used  . Alcohol use No   Allergies   Patient has no known allergies.  Review of Systems Review of Systems 10/14 systems reviewed and are negative other than those stated in the HPI  Physical Exam Updated Vital Signs BP 107/86 (BP Location: Left Arm)   Pulse 70   Temp 98.5 F (36.9 C) (Oral)   Resp 17   LMP 10/23/2016 Comment: thinks it was early Nov, Depo in Dec  SpO2 99%   Physical Exam  Physical Exam  Nursing note and vitals  reviewed. Constitutional: Well developed, well nourished, non-toxic, and in no acute distress Head: Normocephalic and atraumatic.  Mouth/Throat: Oropharynx is clear and moist.  Neck: Normal range of motion. Neck supple.  Cardiovascular: Normal rate and regular rhythm.   Pulmonary/Chest: Effort normal and breath sounds normal.  Abdominal: Soft. There is no tenderness. There is no rebound and no guarding.  Musculoskeletal: Normal range of motion.  Neurological: Alert, no facial droop, fluent speech, moves all extremities symmetrically Skin: Skin is warm and dry.  Psychiatric: Cooperative  Pelvic: Normal external genitalia. Normal internal genitalia. No discharge. No blood within the vagina. No cervical motion tenderness. No adnexal masses or tenderness. Chaperone present throughout entire exam.   ED Treatments / Results  DIAGNOSTIC STUDIES: Oxygen Saturation is 99% on RA, normal by my interpretation.   COORDINATION OF CARE: 12:57 PM-Discussed next steps with pt. Pt verbalized understanding and is agreeable with the plan.   Labs (all labs ordered are listed, but only abnormal results are displayed) Labs Reviewed  WET PREP, GENITAL - Abnormal; Notable for the following:       Result Value   WBC, Wet Prep HPF POC MANY (*)    All other components within normal limits  CBC - Abnormal; Notable for the following:    Hemoglobin 11.2 (*)    HCT 32.6 (*)    MCV 69.2 (*)    MCH 23.8 (*)    RDW 18.8 (*)    All other components within normal limits  LIPASE, BLOOD  COMPREHENSIVE METABOLIC PANEL  URINALYSIS, ROUTINE W REFLEX MICROSCOPIC  I-STAT BETA HCG BLOOD, ED ([facility], [facility], [facility] ONLY)   EKG  EKG Interpretation None      Radiology No results found.  Procedures Procedures   Medications Ordered in ED Medications - No data to display  Initial Impression / Assessment and Plan / ED Course  I have reviewed the triage vital signs and the nursing notes.  Pertinent labs & imaging  results that were available during my care of the patient were reviewed by me and considered in my medical decision making (see chart for details).     Presenting with intermittent abdominal cramping and vaginal bleeding since receiving Depovera shot in December.  She is well-appearing and no decreased distress with normal vital signs. She is a soft and benign abdomen, currently without tenderness. Suspect that her symptoms are side effects of her birth control. She was also just seen at [facility], and was empirically treated for PID. She has also recently undergone STD testing this month at [facility]. No concerns for PID/TOA based on exam currently. Blood work is reassuring. She is not pregnant. Wet prep is negative. Discussed supportive care management for home and follow-up with gynecologist as needed. Strict return and follow-up instructions reviewed. She expressed understanding of all discharge instructions and felt comfortable with the plan of care.   Final Clinical Impressions(s) / ED Diagnoses   Final diagnoses:  Irregular intermenstrual bleeding  Abdominal cramping   New Prescriptions New Prescriptions   No medications on file   I personally performed the services described in this documentation, which was scribed in my presence. The recorded information has been reviewed and is accurate.    Lavera Guiseana Duo Rainn Bullinger, MD 11/15/16 [phone]

## 2016-11-15 NOTE — ED Triage Notes (Signed)
Pt reports having generalized abd pain since December. Having irregularly long periods. [facility] Denies n/v/d.

## 2016-11-15 NOTE — Discharge Instructions (Signed)
Your blood work is reassuring.  Please follow-up with PCP or gynecologist.  Return without fail for worsening symptoms, including fever, worsening pain, intractable vomiting, worsening bleeding (greater than 1 super pad an hour), or any other symptoms concerning to you.
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
## 2017-02-07 ENCOUNTER — Encounter (HOSPITAL_COMMUNITY): Payer: Self-pay | Admitting: *Deleted

## 2017-03-20 ENCOUNTER — Encounter (HOSPITAL_COMMUNITY): Payer: Self-pay | Admitting: *Deleted

## 2017-03-20 ENCOUNTER — Ambulatory Visit (HOSPITAL_COMMUNITY)
Admission: RE | Admit: 2017-03-20 | Discharge: 2017-03-20 | Disposition: A | Discharge status: Home / Self Care | Payer: Self-pay | Source: Ambulatory Visit | Attending: Obstetrics and Gynecology | Admitting: Obstetrics and Gynecology

## 2017-03-20 VITALS — BP 108/70 | Ht 66.0 in | Wt 159.2 lb

## 2017-03-20 DIAGNOSIS — Z1239 Encounter for other screening for malignant neoplasm of breast: Secondary | ICD-10-CM

## 2017-03-20 DIAGNOSIS — R87612 Low grade squamous intraepithelial lesion on cytologic smear of cervix (LGSIL): Secondary | ICD-10-CM

## 2017-03-20 NOTE — Patient Instructions (Signed)
Explained breast self awareness with Meagan [name]. Patient did not need a Pap smear today due to last Pap smear was 12/18/2016. Explained the colposcopy to patient the recommended follow up for her abnormal Pap smear. [facility]. Appointment scheduled for Monday, March 26, 2017 at 1020. Patient aware of appointment and will be there. Let patient know she will need a screening mammogram at age 89 unless clinically indicated prior. Meagan [name] verbalized understanding.  Braydyn Schultes, Kathaleen Maserhristine Poll, RN 9:02 AM

## 2017-03-20 NOTE — Progress Notes (Signed)
[facility] due to having an abnormal Pap smear on 12/18/2016 that a colposcopy is recommended for follow up.  Pap Smear: Pap smear not completed today. [facility]. [facility] for a colps[facility]. Appointment scheduled for Monday, March 26, 2017 at 1020. Per patient has no history of an abnormal Pap smear prior to the most recent Pap smear. Last Pap smear result is in EPIC.  Physical exam: Breasts Breasts symmetrical. No skin abnormalities bilateral breasts. No nipple retraction bilateral breasts. No nipple discharge bilateral breasts. No lymphadenopathy. No lumps palpated bilateral breasts. No complaints of pain or tenderness on exam. Screening mammogram recommended at age 26 unless clinically indicated prior.       Pelvic/Bimanual No Pap smear completed today since last Pap smear was 12/18/2016. Pap smear not indicated per [facility] guidelines.   Smoking History: Patient has never smoked.  Patient Navigation: Patient education provided. Access to services provided for [facility] through [facility].

## 2017-03-21 ENCOUNTER — Encounter (HOSPITAL_COMMUNITY): Payer: Self-pay | Admitting: *Deleted

## 2017-03-26 ENCOUNTER — Telehealth (HOSPITAL_COMMUNITY): Payer: Self-pay | Admitting: *Deleted

## 2017-03-26 ENCOUNTER — Ambulatory Visit: Payer: Self-pay | Admitting: Family Medicine

## 2017-03-26 ENCOUNTER — Encounter: Payer: Self-pay | Admitting: Family Medicine

## 2017-03-26 NOTE — Telephone Encounter (Signed)
Telephoned patient at home number and advised patient of missed colposcopy appointment. Patient will call and reschedule.

## 2017-03-26 NOTE — Progress Notes (Signed)
Patient did not keep appointment today for colposcopy--[facility] will be contacted to re-schedule.

## 2017-04-02 ENCOUNTER — Encounter (HOSPITAL_COMMUNITY): Payer: Self-pay | Admitting: *Deleted

## 2017-05-13 ENCOUNTER — Inpatient Hospital Stay (HOSPITAL_COMMUNITY)
Admission: AD | Admit: 2017-05-13 | Discharge: 2017-05-13 | Disposition: A | Discharge status: Home / Self Care | Payer: Self-pay | Source: Ambulatory Visit | Attending: Obstetrics & Gynecology | Admitting: Obstetrics & Gynecology

## 2017-05-13 ENCOUNTER — Encounter (HOSPITAL_COMMUNITY): Payer: Self-pay

## 2017-05-13 DIAGNOSIS — Z87891 Personal history of nicotine dependence: Secondary | ICD-10-CM | POA: Insufficient documentation

## 2017-05-13 DIAGNOSIS — A599 Trichomoniasis, unspecified: Secondary | ICD-10-CM | POA: Insufficient documentation

## 2017-05-13 DIAGNOSIS — R102 Pelvic and perineal pain: Secondary | ICD-10-CM | POA: Insufficient documentation

## 2017-05-13 DIAGNOSIS — Z7982 Long term (current) use of aspirin: Secondary | ICD-10-CM | POA: Insufficient documentation

## 2017-05-13 LAB — URINALYSIS, ROUTINE W REFLEX MICROSCOPIC
BILIRUBIN URINE: NEGATIVE
Glucose, UA: NEGATIVE mg/dL
HGB URINE DIPSTICK: NEGATIVE
Ketones, ur: NEGATIVE mg/dL
Nitrite: NEGATIVE
Protein, ur: 30 mg/dL — AB
SPECIFIC GRAVITY, URINE: 1.027 (ref 1.005–1.030)
pH: 6 (ref 5.0–8.0)

## 2017-05-13 LAB — HCG, QUANTITATIVE, PREGNANCY: hCG, Beta Chain, Quant, S: <1 m[IU]/mL (ref <5)

## 2017-05-13 LAB — COMPREHENSIVE METABOLIC PANEL
ALT: 17 U/L (ref 14–54)
AST: 13 U/L — ABNORMAL LOW (ref 15–41)
Albumin: 4.4 g/dL (ref 3.5–5.0)
Alkaline Phosphatase: 66 U/L (ref 38–126)
Anion gap: 8 (ref 5–15)
BUN: 8 mg/dL (ref 6–20)
CHLORIDE: 104 mmol/L (ref 101–111)
CO2: 28 mmol/L (ref 22–32)
Calcium: 9.3 mg/dL (ref 8.9–10.3)
Creatinine, Ser: 0.65 mg/dL (ref 0.44–1.00)
Glucose, Bld: 94 mg/dL (ref 65–99)
POTASSIUM: 3.7 mmol/L (ref 3.5–5.1)
Sodium: 140 mmol/L (ref 135–145)
Total Bilirubin: 0.6 mg/dL (ref 0.3–1.2)
Total Protein: 8 g/dL (ref 6.5–8.1)

## 2017-05-13 LAB — CBC
HCT: 33.5 % — ABNORMAL LOW (ref 36.0–46.0)
HEMOGLOBIN: 11.2 g/dL — AB (ref 12.0–15.0)
MCH: 23.3 pg — ABNORMAL LOW (ref 26.0–34.0)
MCHC: 33.4 g/dL (ref 30.0–36.0)
MCV: 69.6 fL — ABNORMAL LOW (ref 78.0–100.0)
PLATELETS: 347 10*3/uL (ref 150–400)
RBC: 4.81 MIL/uL (ref 3.87–5.11)
RDW: 17.5 % — AB (ref 11.5–15.5)
WBC: 5.5 10*3/uL (ref 4.0–10.5)

## 2017-05-13 LAB — WET PREP, GENITAL
CLUE CELLS WET PREP: NONE SEEN
Sperm: NONE SEEN
Yeast Wet Prep HPF POC: NONE SEEN

## 2017-05-13 LAB — LIPASE, BLOOD: LIPASE: 20 U/L (ref 11–51)

## 2017-05-13 LAB — POCT PREGNANCY, URINE: PREG TEST UR: NEGATIVE

## 2017-05-13 LAB — AMYLASE: AMYLASE: 69 U/L (ref 28–100)

## 2017-05-13 MED ORDER — METRONIDAZOLE 500 MG PO TABS
2000.0000 mg | ORAL_TABLET | Freq: Once | ORAL | Status: AC
  Administered 2017-05-13: 2000 mg via ORAL
  Filled 2017-05-13: qty 4

## 2017-05-13 MED ORDER — IBUPROFEN 800 MG PO TABS
800.0000 mg | ORAL_TABLET | Freq: Once | ORAL | Status: AC
  Administered 2017-05-13: 800 mg via ORAL
  Filled 2017-05-13: qty 1

## 2017-05-13 NOTE — MAU Note (Signed)
Took 3 HPT- 2 positive, 1 negative. Reports abdominal pain since July. Says "I feel something kicking me". No bleeding.

## 2017-05-13 NOTE — MAU Provider Note (Signed)
History     CSN: [id number]  Arrival date and time: 05/13/17 1117   First Provider Initiated Contact with Patient 05/13/17 1157      Chief Complaint  Patient presents with  . Pelvic Pain   Meagan [name] is a 26 y.o. [phone] who presents today with abdominal pain x 3 weeks. She states that she has had some negative and some positive pregnancy tests at home. She reports that she is feeling and seeing "movement", and she believes that she is feeling fetal movement. Patient states that in 2014 when she came in to find out she was pregnant with her son she had a negative pregnancy test, and blood test. She reports that the pregnancy only showed up on [place]. On review of the chart patient visit in 12/2012 she had a +UPT here. Blood test was not done at that visit because urine pregnancy test was positive.    Pelvic Pain  The patient's primary symptoms include pelvic pain. The patient's pertinent negatives include no vaginal discharge. This is a new problem. Episode onset: 3 weeks  The problem occurs intermittently. The problem has been unchanged. The pain is severe. The problem affects both sides. She is not pregnant. Associated symptoms include abdominal pain, nausea and vomiting. Pertinent negatives include no chills, dysuria, fever, frequency or urgency. The vaginal discharge was normal. There has been no bleeding. Nothing aggravates the symptoms. She has tried nothing for the symptoms. She is sexually active. She uses nothing for contraception. Her menstrual history has been irregular (LMP 03/22/17 ).     Past Medical History:  Diagnosis Date  . Anemia   . Chlamydia   . Headache(784.0)   . Hypertension    no meds  . Pregnancy induced hypertension     Past Surgical History:  Procedure Laterality Date  . HAND SURGERY      Family History  Problem Relation Age of Onset  . Hypertension Unknown   . Diabetes Unknown   . Diabetes Paternal Grandmother     Social History  Substance  Use Topics  . Smoking status: Former Smoker    Types: Cigarettes  . Smokeless tobacco: Never Used  . Alcohol use No    Allergies: No Known Allergies  Prescriptions Prior to Admission  Medication Sig Dispense Refill Last Dose  . acetaminophen (TYLENOL) 500 MG tablet Take 2 tablets (1,000 mg total) by mouth every 6 (six) hours as needed for mild pain. (Patient not taking: Reported on 03/20/2017) 30 tablet 0 Not Taking  . Aspirin-Acetaminophen-Caffeine (GOODY HEADACHE PO) Take 1 packet by mouth daily as needed (headache).   Not Taking  . naproxen (NAPROSYN) 500 MG tablet Take 1 tablet (500 mg total) by mouth 2 (two) times daily. (Patient not taking: Reported on 10/04/2016) 30 tablet 0 Not Taking    Review of Systems  Constitutional: Negative for chills and fever.  Gastrointestinal: Positive for abdominal pain, nausea and vomiting.  Genitourinary: Positive for pelvic pain. Negative for dysuria, frequency, urgency, vaginal bleeding and vaginal discharge.   Physical Exam   Blood pressure (!) 101/58, pulse 86, temperature 98.1 F (36.7 C), temperature source Oral, resp. rate 18, height 5\' 4"  (1.626 m), weight 148 lb (67.1 kg), last menstrual period 03/22/2017.  Physical Exam  Nursing note and vitals reviewed. Constitutional: She is oriented to person, place, and time. She appears well-developed and well-nourished. No distress.  Cardiovascular: Normal rate.   Respiratory: Effort normal.  GI: Soft. There is no tenderness. There is no rebound.  Observed patient's abdomen while she was reporting feeling movement. I did not see any movement. Patient reporting seeing movement. I did see pulsating likely aortic pulse.   Genitourinary:  Genitourinary Comments:  External: no lesion Vagina: small amount of foamy discharge Cervix: pink, smooth, no CMT Uterus: NSSC Adnexa: NT   Neurological: She is alert and oriented to person, place, and time.  Skin: Skin is warm and dry.  Psychiatric: She has  a normal mood and affect.   Results for orders placed or performed during the hospital encounter of 05/13/17 (from the past 24 hour(s))  Urinalysis, Routine w reflex microscopic     Status: Abnormal   Collection Time: 05/13/17 11:45 AM  Result Value Ref Range   Color, Urine YELLOW YELLOW   APPearance HAZY (A) CLEAR   Specific Gravity, Urine 1.027 1.005 - 1.030   pH 6.0 5.0 - 8.0   Glucose, UA NEGATIVE NEGATIVE mg/dL   Hgb urine dipstick NEGATIVE NEGATIVE   Bilirubin Urine NEGATIVE NEGATIVE   Ketones, ur NEGATIVE NEGATIVE mg/dL   Protein, ur 30 (A) NEGATIVE mg/dL   Nitrite NEGATIVE NEGATIVE   Leukocytes, UA MODERATE (A) NEGATIVE   RBC / HPF 0-5 0 - 5 RBC/hpf   WBC, UA 6-30 0 - 5 WBC/hpf   Bacteria, UA RARE (A) NONE SEEN   Squamous Epithelial / LPF 6-30 (A) NONE SEEN   Mucous PRESENT    Hyaline Casts, UA PRESENT   Pregnancy, urine POC     Status: None   Collection Time: 05/13/17 11:49 AM  Result Value Ref Range   Preg Test, Ur NEGATIVE NEGATIVE  Wet prep, genital     Status: Abnormal   Collection Time: 05/13/17 12:10 PM  Result Value Ref Range   Yeast Wet Prep HPF POC NONE SEEN NONE SEEN   Trich, Wet Prep PRESENT (A) NONE SEEN   Clue Cells Wet Prep HPF POC NONE SEEN NONE SEEN   WBC, Wet Prep HPF POC MANY (A) NONE SEEN   Sperm NONE SEEN   CBC     Status: Abnormal   Collection Time: 05/13/17 12:22 PM  Result Value Ref Range   WBC 5.5 4.0 - 10.5 K/uL   RBC 4.81 3.87 - 5.11 MIL/uL   Hemoglobin 11.2 (L) 12.0 - 15.0 g/dL   HCT 78.2 (L) 95.6 - 21.3 %   MCV 69.6 (L) 78.0 - 100.0 fL   MCH 23.3 (L) 26.0 - 34.0 pg   MCHC 33.4 30.0 - 36.0 g/dL   RDW 08.6 (H) 57.8 - 46.9 %   Platelets 347 150 - 400 K/uL  Comprehensive metabolic panel     Status: Abnormal   Collection Time: 05/13/17 12:22 PM  Result Value Ref Range   Sodium 140 135 - 145 mmol/L   Potassium 3.7 3.5 - 5.1 mmol/L   Chloride 104 101 - 111 mmol/L   CO2 28 22 - 32 mmol/L   Glucose, Bld 94 65 - 99 mg/dL   BUN 8 6 - 20  mg/dL   Creatinine, Ser 6.29 0.44 - 1.00 mg/dL   Calcium 9.3 8.9 - 52.8 mg/dL   Total Protein 8.0 6.5 - 8.1 g/dL   Albumin 4.4 3.5 - 5.0 g/dL   AST 13 (L) 15 - 41 U/L   ALT 17 14 - 54 U/L   Alkaline Phosphatase 66 38 - 126 U/L   Total Bilirubin 0.6 0.3 - 1.2 mg/dL   GFR calc non Af Amer >60 >60 mL/min   GFR calc  Af Amer >60 >60 mL/min   Anion gap 8 5 - 15  Amylase     Status: None   Collection Time: 05/13/17 12:22 PM  Result Value Ref Range   Amylase 69 28 - 100 U/L  Lipase, blood     Status: None   Collection Time: 05/13/17 12:22 PM  Result Value Ref Range   Lipase 20 11 - 51 U/L    MAU Course  Procedures  MDM Treated with 2g flagyl here in MAU. Discussed partner treatment. No intercourse x1 week after partner is treated.  Assessment and Plan   1. Trichomoniasis   2. Pelvic pain in female    DC home Will get outpatient pelvic US  Comfort measures reviewed  RX: none  Return to MAU as needed FU with OB as planned  Follow-up Information    [facility] Follow up.   Specialty:  Radiology Contact information: [address] [id number] [place] [id number] [phone]           Thressa ShellerHeather Xandria Gallaga 05/13/2017, 12:00 PM

## 2017-05-13 NOTE — Discharge Instructions (Signed)

## 2017-05-14 LAB — GC/CHLAMYDIA PROBE AMP (~~LOC~~) NOT AT ARMC
Chlamydia: NEGATIVE
NEISSERIA GONORRHEA: NEGATIVE

## 2017-05-16 ENCOUNTER — Ambulatory Visit (HOSPITAL_COMMUNITY): Payer: Self-pay

## 2017-05-16 ENCOUNTER — Ambulatory Visit (HOSPITAL_COMMUNITY)
Admission: RE | Admit: 2017-05-16 | Discharge: 2017-05-16 | Disposition: A | Discharge status: Home / Self Care | Payer: Self-pay | Source: Ambulatory Visit | Attending: Advanced Practice Midwife | Admitting: Advanced Practice Midwife

## 2017-05-16 DIAGNOSIS — R938 Abnormal findings on diagnostic imaging of other specified body structures: Secondary | ICD-10-CM | POA: Insufficient documentation

## 2017-05-16 DIAGNOSIS — R102 Pelvic and perineal pain: Secondary | ICD-10-CM | POA: Insufficient documentation

## 2017-05-22 ENCOUNTER — Telehealth: Payer: Self-pay

## 2017-05-22 NOTE — Telephone Encounter (Signed)
Received message from Radiology to contact pt to inform about [place] results.  Contacted pt and informed her that her [place] that do not show anything significant nor GYN related.  Pt stated understanding.

## 2017-06-06 ENCOUNTER — Telehealth (HOSPITAL_COMMUNITY): Payer: Self-pay | Admitting: *Deleted

## 2017-07-02 NOTE — Telephone Encounter (Signed)
Reminder call

## 2017-07-11 ENCOUNTER — Inpatient Hospital Stay (HOSPITAL_COMMUNITY)
Admission: AD | Admit: 2017-07-11 | Discharge: 2017-07-11 | Disposition: A | Discharge status: Home / Self Care | Payer: Self-pay | Source: Ambulatory Visit | Attending: Family Medicine | Admitting: Family Medicine

## 2017-07-11 DIAGNOSIS — D649 Anemia, unspecified: Secondary | ICD-10-CM | POA: Insufficient documentation

## 2017-07-11 DIAGNOSIS — R109 Unspecified abdominal pain: Secondary | ICD-10-CM | POA: Insufficient documentation

## 2017-07-11 DIAGNOSIS — N898 Other specified noninflammatory disorders of vagina: Secondary | ICD-10-CM | POA: Insufficient documentation

## 2017-07-11 DIAGNOSIS — A749 Chlamydial infection, unspecified: Secondary | ICD-10-CM | POA: Insufficient documentation

## 2017-07-11 DIAGNOSIS — Z87891 Personal history of nicotine dependence: Secondary | ICD-10-CM | POA: Insufficient documentation

## 2017-07-11 DIAGNOSIS — I10 Essential (primary) hypertension: Secondary | ICD-10-CM | POA: Insufficient documentation

## 2017-07-11 DIAGNOSIS — B9689 Other specified bacterial agents as the cause of diseases classified elsewhere: Secondary | ICD-10-CM | POA: Insufficient documentation

## 2017-07-11 DIAGNOSIS — N390 Urinary tract infection, site not specified: Secondary | ICD-10-CM

## 2017-07-11 DIAGNOSIS — N760 Acute vaginitis: Secondary | ICD-10-CM | POA: Insufficient documentation

## 2017-07-11 LAB — URINALYSIS, ROUTINE W REFLEX MICROSCOPIC
Bilirubin Urine: NEGATIVE
Glucose, UA: NEGATIVE mg/dL
Hgb urine dipstick: NEGATIVE
KETONES UR: NEGATIVE mg/dL
Nitrite: NEGATIVE
PROTEIN: NEGATIVE mg/dL
Specific Gravity, Urine: 1.019 (ref 1.005–1.030)
pH: 6 (ref 5.0–8.0)

## 2017-07-11 LAB — POCT PREGNANCY, URINE: Preg Test, Ur: NEGATIVE

## 2017-07-11 LAB — WET PREP, GENITAL
SPERM: NONE SEEN
TRICH WET PREP: NONE SEEN
YEAST WET PREP: NONE SEEN

## 2017-07-11 MED ORDER — METRONIDAZOLE 500 MG PO TABS: 500.0000 mg | ORAL_TABLET | Freq: Two times a day (BID) | ORAL | 0 refills | Status: DC

## 2017-07-11 MED ORDER — SULFAMETHOXAZOLE-TRIMETHOPRIM 800-160 MG PO TABS: 1.0000 | ORAL_TABLET | Freq: Two times a day (BID) | ORAL | 0 refills | Status: AC

## 2017-07-11 MED ORDER — MEDROXYPROGESTERONE ACETATE 150 MG/ML IM SUSP
150.0000 mg | Freq: Once | INTRAMUSCULAR | Status: AC
  Administered 2017-07-11: 150 mg via INTRAMUSCULAR
  Filled 2017-07-11: qty 1

## 2017-07-11 NOTE — MAU Provider Note (Signed)
History     CSN: [id number]  Arrival date and time: 07/11/17 1414   First Provider Initiated Contact with Patient 07/11/17 1530      Chief Complaint  Patient presents with  . Abdominal Pain  . Vaginal Discharge   HPI   [name] is a 26 y.o. female [phone] here in MAU with concerns about STI. States she has a new partner and thinks she contacted something from him. She attests to 2 new partners in the last year. She has a non-odorous discharge that is yellow in color. No bleeding or spotting. + painful intercourse. Has not tried anything over the counter for the symptoms. Has had trichomonas in the past; this feels the same.   OB History    Gravida Para Term Preterm AB Living   0 1 3   SAB TAB Ectopic Multiple Live Births   0 0 0 0 3      Past Medical History:  Diagnosis Date  . Anemia   . Chlamydia   . Headache(784.0)   . Hypertension    no meds  . Pregnancy induced hypertension     Past Surgical History:  Procedure Laterality Date  . HAND SURGERY      Family History  Problem Relation Age of Onset  . Hypertension Unknown   . Diabetes Unknown   . Diabetes Paternal Grandmother     Social History  Substance Use Topics  . Smoking status: Former Smoker    Types: Cigarettes  . Smokeless tobacco: Never Used  . Alcohol use No    Allergies: No Known Allergies  No prescriptions prior to admission.   Results for orders placed or performed during the hospital encounter of 07/11/17 (from the past 48 hour(s))  Urinalysis, Routine w reflex microscopic     Status: Abnormal   Collection Time: 07/11/17  2:16 PM  Result Value Ref Range   Color, Urine YELLOW YELLOW   APPearance CLEAR CLEAR   Specific Gravity, Urine 1.019 1.005 - 1.030   pH 6.0 5.0 - 8.0   Glucose, UA NEGATIVE NEGATIVE mg/dL   Hgb urine dipstick NEGATIVE NEGATIVE   Bilirubin Urine NEGATIVE NEGATIVE   Ketones, ur NEGATIVE NEGATIVE mg/dL   Protein, ur NEGATIVE NEGATIVE mg/dL    Nitrite NEGATIVE NEGATIVE   Leukocytes, UA MODERATE (A) NEGATIVE   RBC / HPF 0-5 0 - 5 RBC/hpf   WBC, UA TOO NUMEROUS TO COUNT 0 - 5 WBC/hpf   Bacteria, UA RARE (A) NONE SEEN   Squamous Epithelial / LPF 0-5 (A) NONE SEEN   Mucus PRESENT   Pregnancy, urine POC     Status: None   Collection Time: 07/11/17  2:29 PM  Result Value Ref Range   Preg Test, Ur NEGATIVE NEGATIVE    Comment:        THE SENSITIVITY OF THIS METHODOLOGY IS >24 mIU/mL   Wet prep, genital     Status: Abnormal   Collection Time: 07/11/17  3:40 PM  Result Value Ref Range   Yeast Wet Prep HPF POC NONE SEEN NONE SEEN   Trich, Wet Prep NONE SEEN NONE SEEN   Clue Cells Wet Prep HPF POC PRESENT (A) NONE SEEN   WBC, Wet Prep HPF POC MODERATE (A) NONE SEEN    Comment: MANY BACTERIA SEEN   Sperm NONE SEEN    Review of Systems  Constitutional: Negative for fever.  Gastrointestinal: Positive for abdominal pain. Negative for nausea and vomiting.  Genitourinary: Positive  for dyspareunia and vaginal discharge. Negative for dysuria ("feels weird when I urinate") and vaginal bleeding.   Physical Exam   Blood pressure 126/68, pulse 83, temperature (!) 97.5 F (36.4 C), temperature source Oral, resp. rate 16, last menstrual period 07/01/2017, SpO2 99 %.  Physical Exam  Constitutional: She is oriented to person, place, and time. She appears well-developed and well-nourished. No distress.  HENT:  Head: Normocephalic.  Eyes: Pupils are equal, round, and reactive to light.  Respiratory: Effort normal.  GI: Soft. Normal appearance. There is tenderness in the suprapubic area. There is no rigidity, no rebound and no guarding.  Genitourinary:  Genitourinary Comments: Wet prep and GC collected without speculum   Musculoskeletal: Normal range of motion.  Neurological: She is alert and oriented to person, place, and time.  Skin: Skin is warm. She is not diaphoretic.  Psychiatric: Her behavior is normal.    MAU Course   Procedures  None  MDM  Wet prep and GC collected HIV and RPR pending  Urine pregnancy test negative UA  Assessment and Plan   A:   1. Acute UTI   2. BV (bacterial vaginosis)     P:  Discharge home in stable condition Rx: Bactrim & Flagyl No alcohol Return to MAU for emergencies only  Venia Carbon I, NP 07/11/2017 5:56 PM

## 2017-07-11 NOTE — MAU Note (Signed)
+  lower abdominal cramping Rating pain 8/10 Constant pain  +vaginal discharge Yellow with odor  States symptoms feel like the same from a previous visit where she was treated for trich. States she needs to be retreated.   LMP 07/01/17

## 2017-07-11 NOTE — Discharge Instructions (Signed)

## 2017-07-12 LAB — GC/CHLAMYDIA PROBE AMP (~~LOC~~) NOT AT ARMC
Chlamydia: NEGATIVE
NEISSERIA GONORRHEA: POSITIVE — AB

## 2017-07-12 LAB — HIV ANTIBODY (ROUTINE TESTING W REFLEX): HIV Screen 4th Generation wRfx: NONREACTIVE

## 2017-07-12 LAB — RPR: RPR: NONREACTIVE

## 2017-08-04 ENCOUNTER — Emergency Department (HOSPITAL_COMMUNITY)
Admission: EM | Admit: 2017-08-04 | Discharge: 2017-08-04 | Disposition: A | Discharge status: Home / Self Care | Payer: No Typology Code available for payment source | Attending: Emergency Medicine | Admitting: Emergency Medicine

## 2017-08-04 ENCOUNTER — Emergency Department (HOSPITAL_COMMUNITY): Payer: No Typology Code available for payment source

## 2017-08-04 ENCOUNTER — Encounter (HOSPITAL_COMMUNITY): Payer: Self-pay

## 2017-08-04 DIAGNOSIS — S299XXA Unspecified injury of thorax, initial encounter: Secondary | ICD-10-CM | POA: Diagnosis present

## 2017-08-04 DIAGNOSIS — Y92410 Unspecified street and highway as the place of occurrence of the external cause: Secondary | ICD-10-CM | POA: Diagnosis not present

## 2017-08-04 DIAGNOSIS — Y9389 Activity, other specified: Secondary | ICD-10-CM | POA: Diagnosis not present

## 2017-08-04 DIAGNOSIS — Z87891 Personal history of nicotine dependence: Secondary | ICD-10-CM | POA: Insufficient documentation

## 2017-08-04 DIAGNOSIS — Y999 Unspecified external cause status: Secondary | ICD-10-CM | POA: Insufficient documentation

## 2017-08-04 DIAGNOSIS — I10 Essential (primary) hypertension: Secondary | ICD-10-CM | POA: Insufficient documentation

## 2017-08-04 DIAGNOSIS — S29012A Strain of muscle and tendon of back wall of thorax, initial encounter: Secondary | ICD-10-CM

## 2017-08-04 DIAGNOSIS — Z79899 Other long term (current) drug therapy: Secondary | ICD-10-CM | POA: Insufficient documentation

## 2017-08-04 DIAGNOSIS — R52 Pain, unspecified: Secondary | ICD-10-CM

## 2017-08-04 MED ORDER — NAPROXEN 500 MG PO TABS: 500.0000 mg | ORAL_TABLET | Freq: Two times a day (BID) | ORAL | 0 refills | Status: DC

## 2017-08-04 MED ORDER — CYCLOBENZAPRINE HCL 10 MG PO TABS: 10.0000 mg | ORAL_TABLET | Freq: Two times a day (BID) | ORAL | 0 refills | Status: DC | PRN
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
## 2017-08-04 NOTE — Discharge Instructions (Signed)
Try ice/heat. Naprosyn for pain. Flexeril for muscle spasms. Avoid strenuous activity. Stretch and massage. Follow up with family doctor as needed.
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
## 2017-08-04 NOTE — ED Notes (Signed)
Declined W/C at D/C and was escorted to lobby by RN. 

## 2019-11-03 HISTORY — PX: COSMETIC SURGERY: SHX468

## 2019-12-12 ENCOUNTER — Encounter (HOSPITAL_COMMUNITY): Payer: Self-pay

## 2019-12-12 ENCOUNTER — Ambulatory Visit (HOSPITAL_COMMUNITY)
Admission: EM | Admit: 2019-12-12 | Discharge: 2019-12-12 | Disposition: A | Discharge status: Home / Self Care | Payer: Self-pay | Attending: Family Medicine | Admitting: Family Medicine

## 2019-12-12 ENCOUNTER — Other Ambulatory Visit: Payer: Self-pay

## 2019-12-12 DIAGNOSIS — N898 Other specified noninflammatory disorders of vagina: Secondary | ICD-10-CM

## 2019-12-12 LAB — POCT URINALYSIS DIP (DEVICE)
Bilirubin Urine: NEGATIVE
Glucose, UA: NEGATIVE mg/dL
Ketones, ur: NEGATIVE mg/dL
Nitrite: NEGATIVE
Protein, ur: 30 mg/dL — AB
Specific Gravity, Urine: >=1.03 (ref 1.005–1.030)
Urobilinogen, UA: 0.2 mg/dL (ref 0.0–1.0)
pH: 6 (ref 5.0–8.0)

## 2019-12-12 MED ORDER — METRONIDAZOLE 500 MG PO TABS: 500.0000 mg | ORAL_TABLET | Freq: Two times a day (BID) | ORAL | 0 refills | Status: AC

## 2019-12-12 MED ORDER — NYSTATIN-TRIAMCINOLONE 100000-0.1 UNIT/GM-% EX CREA: TOPICAL_CREAM | CUTANEOUS | 0 refills | Status: AC

## 2019-12-12 MED ORDER — FLUCONAZOLE 150 MG PO TABS: 150.0000 mg | ORAL_TABLET | Freq: Every day | ORAL | 0 refills | Status: AC

## 2019-12-12 NOTE — ED Triage Notes (Signed)
Pt presents with c/o vaginal irritation, burning, and white discharge worsening x 3 days.  No odor.  Also burning with urination.  No known exposure.

## 2019-12-12 NOTE — Discharge Instructions (Signed)
Sending some medications to the pharmacy to treat for yeast infection and bacterial infection. Use the medications as prescribed. We will call with any positive results of your swab Follow up as needed for continued or worsening symptoms

## 2019-12-13 LAB — URINE CULTURE

## 2019-12-15 NOTE — ED Provider Notes (Signed)
[place]    CSN: [id number] Arrival date & time: 12/12/19  1343      History   Chief Complaint Chief Complaint  Patient presents with  . SEXUALLY TRANSMITTED DISEASE    HPI Meagan Meagan [name] is Meagan [name] 29 y.o. female.   Patient is Meagan [name] 29 year old female past medical history of anemia, chlamydia, trichomonas, headache.  She presents today with vaginal irritation, burning and white discharge.  This is been worsening over the past 3 days.  She denies any specific odor.  She is also had some mild dysuria.  No specific concerns for STDs.  Denies any abdominal pain, back pain, fevers, nausea or vomiting.No LMP recorded (within months). (Menstrual status: Irregular Periods).  ROS per HPI      Past Medical History:  Diagnosis Date  . Anemia   . Chlamydia   . Headache(784.0)   . Hypertension    no meds  . Pregnancy induced hypertension     Patient Active Problem List   Diagnosis Date Noted  . MDD (major depressive disorder) 06/29/2015  . Overdose of benzodiazepine   . Benzodiazepine overdose of undetermined intent 06/28/2015  . Major depressive disorder, single episode 06/27/2015    Past Surgical History:  Procedure Laterality Date  . COSMETIC SURGERY  11/03/2019   [place] butt-lift and liposuction.   Marland Kitchen HAND SURGERY      OB History    Gravida  4   Para  3   Term  3   Preterm  0   AB  1   Living  3     SAB  0   TAB  0   Ectopic  0   Multiple  0   Live Births  3            Home Medications    Prior to Admission medications   Medication Sig Start Date End Date Taking? Authorizing Provider  fluconazole (DIFLUCAN) 150 MG tablet Take 1 tablet (150 mg total) by mouth daily. 12/12/19   Loura Halt A, NP  metroNIDAZOLE (FLAGYL) 500 MG tablet Take 1 tablet (500 mg total) by mouth 2 (two) times daily. 12/12/19   Orvan July, NP  nystatin-triamcinolone (MYCOLOG II) cream Apply to affected area daily 12/12/19   Orvan July, NP    Family  History Family History  Problem Relation Age of Onset  . Hypertension Other   . Diabetes Other   . Diabetes Paternal Grandmother     Social History Social History   Tobacco Use  . Smoking status: Former Smoker    Types: Cigarettes  . Smokeless tobacco: Never Used  Substance Use Topics  . Alcohol use: No  . Drug use: No     Allergies   Patient has no known allergies.   Review of Systems Review of Systems   Physical Exam Triage Vital Signs ED Triage Vitals  Enc Vitals Group     BP 12/12/19 1405 131/72     Pulse Rate 12/12/19 1405 92     Resp 12/12/19 1405 18     Temp 12/12/19 1405 99 F (37.2 C)     Temp Source 12/12/19 1405 Oral     SpO2 12/12/19 1405 95 %     Weight --      Height --      Head Circumference --      Peak Flow --      Pain Score 12/12/19 1400 8     Pain Loc --  Pain Edu? --      Excl. in GC? --    No data found.  Updated Vital Signs BP 131/72 (BP Location: Right Arm)   Pulse 92   Temp 99 F (37.2 C) (Oral)   Resp 18   LMP  (Within Months)   SpO2 95%   Visual Acuity Right Eye Distance:   Left Eye Distance:   Bilateral Distance:    Right Eye Near:   Left Eye Near:    Bilateral Near:     Physical Exam Vitals and nursing note reviewed.  Constitutional:      General: She is not in acute distress.    Appearance: Normal appearance. She is not ill-appearing, toxic-appearing or diaphoretic.  HENT:     Head: Normocephalic.     Nose: Nose normal.  Pulmonary:     Effort: Pulmonary effort is normal.  Musculoskeletal:        General: Normal range of motion.     Cervical back: Normal range of motion.  Skin:    General: Skin is warm and dry.     Findings: No rash.  Neurological:     Mental Status: She is alert.  Psychiatric:        Mood and Affect: Mood normal.      UC Treatments / Results  Labs (all labs ordered are listed, but only abnormal results are displayed) Labs Reviewed  URINE CULTURE - Abnormal; Notable for  the following components:      Result Value   Culture MULTIPLE SPECIES PRESENT, SUGGEST RECOLLECTION (*)    All other components within normal limits  POCT URINALYSIS DIP (DEVICE) - Abnormal; Notable for the following components:   Hgb urine dipstick SMALL (*)    Protein, ur 30 (*)    Leukocytes,Ua LARGE (*)    All other components within normal limits  CERVICOVAGINAL ANCILLARY ONLY    EKG   Radiology No results found.  Procedures Procedures (including critical care time)  Medications Ordered in UC Medications - No data to display  Initial Impression / Assessment and Plan / UC Course  I have reviewed the triage vital signs and the nursing notes.  Pertinent labs & imaging results that were available during my care of the patient were reviewed by me and considered in my medical decision making (see chart for details).    Vaginal discharge-treating prophylactically for BV and yeast. Swab sent for testing with labs pending. Urine with large leuks and small hemoglobin.  Sending for culture.  Most likely this is from vaginal infection. Follow up as needed for continued or worsening symptoms  Final Clinical Impressions(s) / UC Diagnoses   Final diagnoses:  Vaginal discharge     Discharge Instructions     Sending some medications to the pharmacy to treat for yeast infection and bacterial infection. Use the medications as prescribed. We will call with any positive results of your swab Follow up as needed for continued or worsening symptoms     ED Prescriptions    Medication Sig Dispense Auth. Provider   fluconazole (DIFLUCAN) 150 MG tablet Take 1 tablet (150 mg total) by mouth daily. 2 tablet Meagan Lorenzen A, NP   nystatin-triamcinolone (MYCOLOG II) cream Apply to affected area daily 15 g Meagan Eliasen A, NP   metroNIDAZOLE (FLAGYL) 500 MG tablet Take 1 tablet (500 mg total) by mouth 2 (two) times daily. 14 tablet Meagan Templin A, NP     PDMP not reviewed this encounter.    Jaci Lazier,  Cane Dubray A, NP 12/15/19 0800

## 2019-12-17 LAB — CERVICOVAGINAL ANCILLARY ONLY
Bacterial vaginitis: POSITIVE — AB
Candida vaginitis: POSITIVE — AB
Chlamydia: NEGATIVE
Neisseria Gonorrhea: NEGATIVE
Trichomonas: NEGATIVE

## 2020-01-13 ENCOUNTER — Encounter (HOSPITAL_COMMUNITY): Payer: Self-pay | Admitting: Emergency Medicine

## 2020-01-13 ENCOUNTER — Emergency Department (HOSPITAL_COMMUNITY)
Admission: EM | Admit: 2020-01-13 | Discharge: 2020-01-13 | Disposition: A | Discharge status: Home / Self Care | Payer: HRSA Program | Attending: Emergency Medicine | Admitting: Emergency Medicine

## 2020-01-13 ENCOUNTER — Other Ambulatory Visit: Payer: Self-pay

## 2020-01-13 DIAGNOSIS — R519 Headache, unspecified: Secondary | ICD-10-CM | POA: Diagnosis not present

## 2020-01-13 DIAGNOSIS — R197 Diarrhea, unspecified: Secondary | ICD-10-CM | POA: Diagnosis not present

## 2020-01-13 DIAGNOSIS — I10 Essential (primary) hypertension: Secondary | ICD-10-CM | POA: Insufficient documentation

## 2020-01-13 DIAGNOSIS — R438 Other disturbances of smell and taste: Secondary | ICD-10-CM | POA: Insufficient documentation

## 2020-01-13 DIAGNOSIS — M7918 Myalgia, other site: Secondary | ICD-10-CM | POA: Diagnosis present

## 2020-01-13 DIAGNOSIS — U071 COVID-19: Secondary | ICD-10-CM | POA: Insufficient documentation

## 2020-01-13 DIAGNOSIS — Z87891 Personal history of nicotine dependence: Secondary | ICD-10-CM | POA: Insufficient documentation

## 2020-01-13 DIAGNOSIS — R509 Fever, unspecified: Secondary | ICD-10-CM | POA: Insufficient documentation

## 2020-01-13 DIAGNOSIS — B349 Viral infection, unspecified: Secondary | ICD-10-CM

## 2020-01-13 DIAGNOSIS — Z20822 Contact with and (suspected) exposure to covid-19: Secondary | ICD-10-CM

## 2020-01-13 LAB — SARS CORONAVIRUS 2 (TAT 6-24 HRS): SARS Coronavirus 2: POSITIVE — AB

## 2020-01-13 NOTE — Discharge Instructions (Addendum)
Home to quarantine for total of 10 days since onset of illness.  All family members should also plan to quarantine for at least 10 days.

## 2020-01-13 NOTE — ED Notes (Signed)
Patient verbalizes understanding of discharge instructions. Opportunity for questioning and answers were provided. Armband removed by staff, pt discharged from ED.  

## 2020-01-13 NOTE — ED Provider Notes (Signed)
[facility] EM[facility] DEPART[facility] Provider Note   CSN: [id number] Arrival date & time: 01/13/20  1534     History Chief Complaint  Patient presents with  . Jaw Pain    Meagan [name] is a 29 y.o. female.  29 year old female presents with complaint of body aches, headaches, runny nose, diarrhea, loss of sense of smell and taste.  Patient states her symptoms started about 2 days ago, felt like she had a fever at that time but did not check her temperature.  Patient states that she has scheduled appointments for her kids to get tested at [facility] for Covid tomorrow however came to the ER today to be checked out.  Patient has a past medical history of hypertension.  No other complaints or concerns today.        Past Medical History:  Diagnosis Date  . Anemia   . Chlamydia   . Headache(784.0)   . Hypertension    no meds  . Pregnancy induced hypertension     Patient Active Problem List   Diagnosis Date Noted  . MDD (major depressive disorder) 06/29/2015  . Overdose of benzodiazepine   . Benzodiazepine overdose of undetermined intent 06/28/2015  . Major depressive disorder, single episode 06/27/2015    Past Surgical History:  Procedure Laterality Date  . COSMETIC SURGERY  11/03/2019   [place] butt-lift and liposuction.   Marland Kitchen HAND SURGERY       OB History    Gravida  4   Para  3   Term  3   Preterm  0   AB  1   Living  3     SAB  0   TAB  0   Ectopic  0   Multiple  0   Live Births  3           Family History  Problem Relation Age of Onset  . Hypertension Other   . Diabetes Other   . Diabetes Paternal Grandmother     Social History   Tobacco Use  . Smoking status: Former Smoker    Types: Cigarettes  . Smokeless tobacco: Never Used  Substance Use Topics  . Alcohol use: No  . Drug use: No    Home Medications Prior to Admission medications   Medication Sig Start Date End Date Taking? Authorizing Provider    fluconazole (DIFLUCAN) 150 MG tablet Take 1 tablet (150 mg total) by mouth daily. 12/12/19   Loura Halt A, NP  metroNIDAZOLE (FLAGYL) 500 MG tablet Take 1 tablet (500 mg total) by mouth 2 (two) times daily. 12/12/19   Orvan July, NP  nystatin-triamcinolone (MYCOLOG II) cream Apply to affected area daily 12/12/19   Orvan July, NP    Allergies    Patient has no known allergies.  Review of Systems   Review of Systems  Constitutional: Positive for fever. Negative for chills and diaphoresis.  HENT: Positive for rhinorrhea.   Respiratory: Positive for cough. Negative for shortness of breath.   Cardiovascular: Negative for chest pain.  Gastrointestinal: Positive for diarrhea. Negative for abdominal pain, blood in stool, nausea and vomiting.  Genitourinary: Negative for dysuria.  Musculoskeletal: Positive for arthralgias and myalgias.  Skin: Negative for wound.  Allergic/Immunologic: Negative for immunocompromised state.  Neurological: Positive for headaches.  All other systems reviewed and are negative.   Physical Exam Updated Vital Signs BP 115/73 (BP Location: Left Arm)   Pulse 93   Temp 99.7 F (37.6 C) (Oral)  Resp 18   Ht 5\' 4"  (1.626 m)   Wt 83.5 kg   SpO2 100%   BMI 31.58 kg/m   Physical Exam Vitals and nursing note reviewed.  Constitutional:      General: She is not in acute distress.    Appearance: She is well-developed. She is not diaphoretic.  HENT:     Head: Normocephalic and atraumatic.  Cardiovascular:     Rate and Rhythm: Normal rate and regular rhythm.     Pulses: Normal pulses.     Heart sounds: Normal heart sounds.  Pulmonary:     Effort: Pulmonary effort is normal.     Breath sounds: Normal breath sounds.  Musculoskeletal:     Cervical back: Neck supple. No rigidity.  Skin:    General: Skin is warm and dry.     Findings: No erythema or rash.  Neurological:     Mental Status: She is alert and oriented to person, place, and time.  Psychiatric:         Behavior: Behavior normal.     ED Results / Procedures / Treatments   Labs (all labs ordered are listed, but only abnormal results are displayed) Labs Reviewed  SARS CORONAVIRUS 2 (TAT 6-24 HRS)    EKG None  Radiology No results found.  Procedures Procedures (including critical care time)  Medications Ordered in ED Medications - No data to display  ED Course  I have reviewed the triage vital signs and the nursing notes.  Pertinent labs & imaging results that were available during my care of the patient were reviewed by me and considered in my medical decision making (see chart for details).  Clinical Course as of Jan 13 1707  Tue Jan 13, 2020  3039 29 year old 29 year old female with symptoms consistent and concerning for COVID-19 infection.  Denies shortness of breath.  Patient will be tested for Covid, advised to quarantine regardless of her results for the next 10 days.  Vitals are reassuring including O2 sat of 100% on room air, patient is afebrile.  Patient is well-appearing, lung sounds are clear.  Patient verbalizes understanding of discharge instructions and plan.   [LM]  1707 GINNETTE [name] was evaluated in Emergency Department on 01/13/2020 for the symptoms described in the history of present illness. She was evaluated in the context of the global COVID-19 pandemic, which necessitated consideration that the patient might be at risk for infection with the SARS-CoV-2 virus that causes COVID-19. Institutional protocols and algorithms that pertain to the evaluation of patients at risk for COVID-19 are in a state of rapid change based on information released by regulatory bodies including the CDC and federal and state organizations. These policies and algorithms were followed during the patient's care in the ED.     [LM]    Clinical Course User Index [LM] 01/15/2020   MDM Rules/Calculators/A&P                      Final Clinical Impression(s) /  ED Diagnoses Final diagnoses:  Viral illness  Suspected COVID-19 virus infection    Rx / DC Orders ED Discharge Orders    None       Alden Hipp, PA-C 01/13/20 1708    01/15/20, MD 01/15/20 1326

## 2020-01-13 NOTE — ED Triage Notes (Addendum)
Pt reports bilateral jaw pain for 3 days pt states it radiates into head.Pt states eating and drinking makes this pain worse. No known injury or trauma to jaw.  Pt states this gets worse at night and has felt unable to sleep. Pt has been using goody powders and Nyquil.
